# Patient Record
Sex: Female | Born: 1955 | Race: White | Hispanic: No | Smoking: Current every day smoker
Health system: Southern US, Community
[De-identification: ages and names within clinical notes are randomized; demographics above are authoritative.]

## PROBLEM LIST (undated history)

## (undated) DIAGNOSIS — I10 Essential (primary) hypertension: Secondary | ICD-10-CM

## (undated) DIAGNOSIS — F329 Major depressive disorder, single episode, unspecified: Secondary | ICD-10-CM

## (undated) DIAGNOSIS — E785 Hyperlipidemia, unspecified: Secondary | ICD-10-CM

## (undated) DIAGNOSIS — F32A Depression, unspecified: Secondary | ICD-10-CM

## (undated) HISTORY — DX: Essential (primary) hypertension: I10

## (undated) HISTORY — PX: BACK SURGERY: SHX140

## (undated) HISTORY — DX: Depression, unspecified: F32.A

## (undated) HISTORY — DX: Hyperlipidemia, unspecified: E78.5

---
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
## 2006-06-08 ENCOUNTER — Ambulatory Visit: Payer: Self-pay | Admitting: Oncology

## 2006-06-10 ENCOUNTER — Other Ambulatory Visit: Admission: RE | Admit: 2006-06-10 | Discharge: 2006-06-10 | Payer: Self-pay | Admitting: Oncology

## 2006-06-25 ENCOUNTER — Encounter (INDEPENDENT_AMBULATORY_CARE_PROVIDER_SITE_OTHER): Payer: Self-pay | Admitting: Specialist

## 2006-06-25 ENCOUNTER — Ambulatory Visit (HOSPITAL_COMMUNITY): Admission: RE | Admit: 2006-06-25 | Discharge: 2006-06-25 | Payer: Self-pay | Admitting: Oncology

## 2006-07-30 ENCOUNTER — Ambulatory Visit: Payer: Self-pay | Admitting: Oncology

## 2006-10-14 ENCOUNTER — Inpatient Hospital Stay (HOSPITAL_COMMUNITY): Admission: RE | Admit: 2006-10-14 | Discharge: 2006-10-18 | Payer: Self-pay | Admitting: Orthopaedic Surgery

## 2006-12-10 ENCOUNTER — Ambulatory Visit: Payer: Self-pay | Admitting: Oncology

## 2007-01-07 IMAGING — XA IR FLUORO GUIDE NDL PLMT / BX
1 series · 13 of 19 positions shown · non-contrast
Comparison: none

CLINICAL DATA: Patient with abnormal T12 vertebral body.  Previous history of back surgery. 
FLUOROSCOPICALLY GUIDED NEEDLE PLACEMENT FOR DEEP CORE BONE BIOPSY AT T12: 
Following the full explanation of the procedure along with the potentially associated complications, an informed witnessed consent was obtained.  
The patient was laid prone on the fluoroscopic table.  The skin overlying the thoracic region was prepped and draped in the usual sterile fashion.  The right pedicle at T12 was identified and infiltrated with 0.25% bupivacaine, including the overlying skin.  Using biplane intermittent fluoroscopy, an 11-gauge Jamshidi needle was advanced through the right pedicle into the posterior one-third at T12.  This was then exchanged for a KyphX advanced osteo introducer system comprised of a working cannula and a KyphX osteo drill.  This combination was then advanced until the tip of the KyphX osteo drill was in the posterior one-third at T12.  At this time, the bone pin was removed.  In a medial trajectory, the combination was advanced until the tip of the working cannula was inside the posterior third at T12.  At this time, the KyphX osteo drill was removed and a core sample sent for pathologic analysis.  
Through the working cannula, two more passes were made with the KyphX bone biopsy device in different directions, obtaining additional 4 ounces, which were sent. 
There were no acute complications.  The patient tolerated the procedure well.  
Medications utilized:  Versed 5 mg IV, fentanyl 250 mcg IV. 
The needle was then retrieved and removed, and hemostasis was achieved over the overlying skin.

[Series 1: run · 13 of 19 slices shown]
[im 1/19]
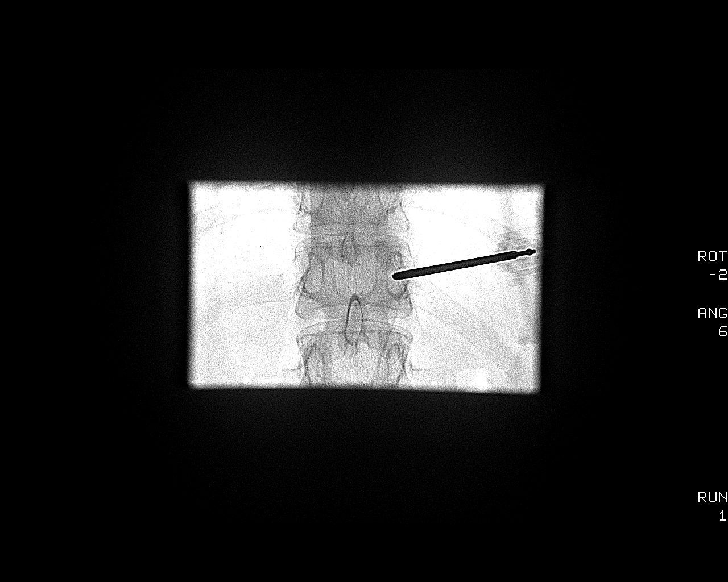
[im 3/19]
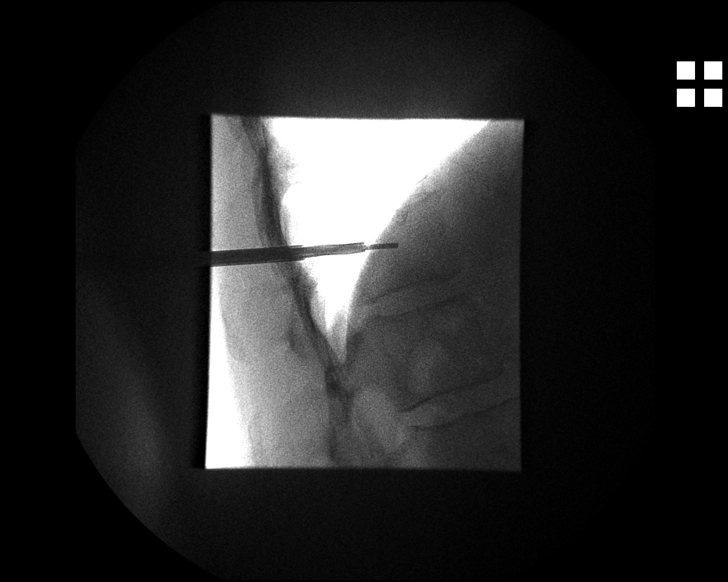
[im 4/19]
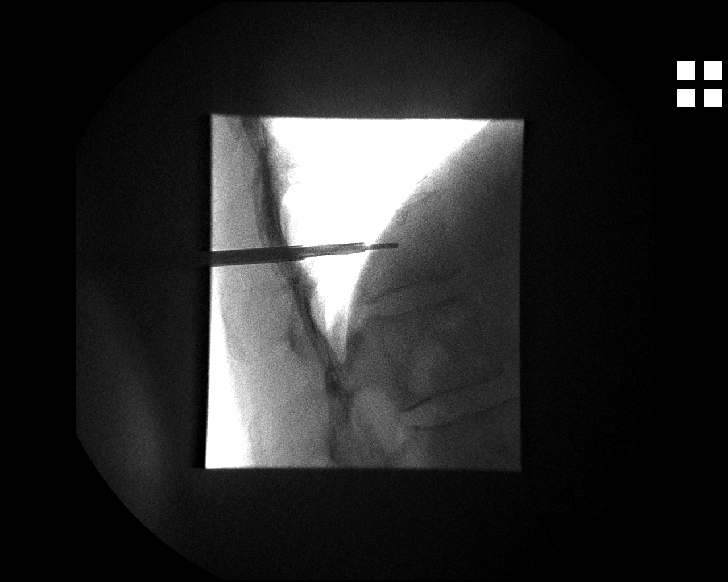
[im 6/19]
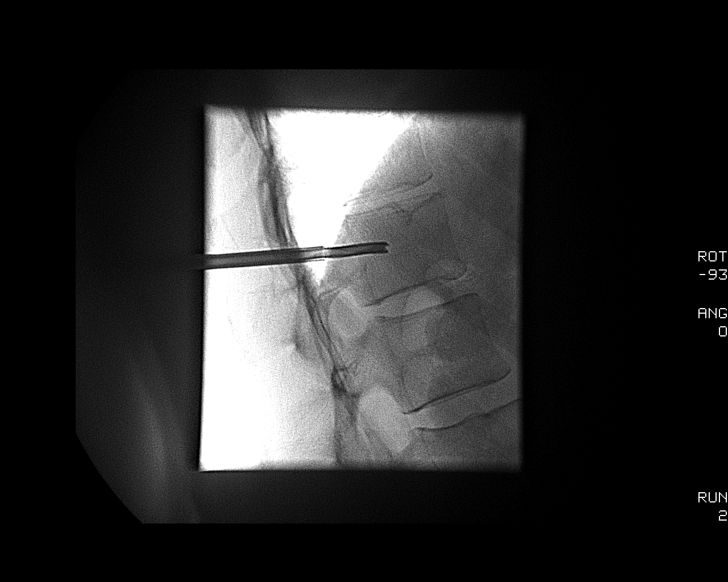
[im 7/19]
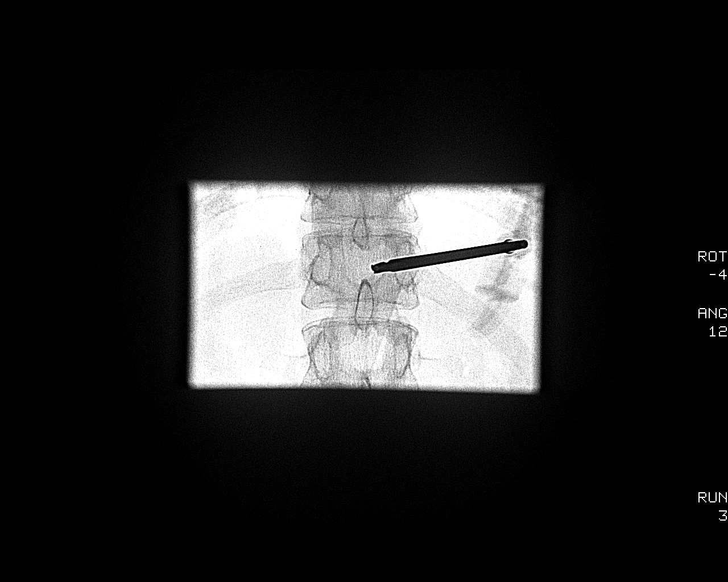
[im 9/19]
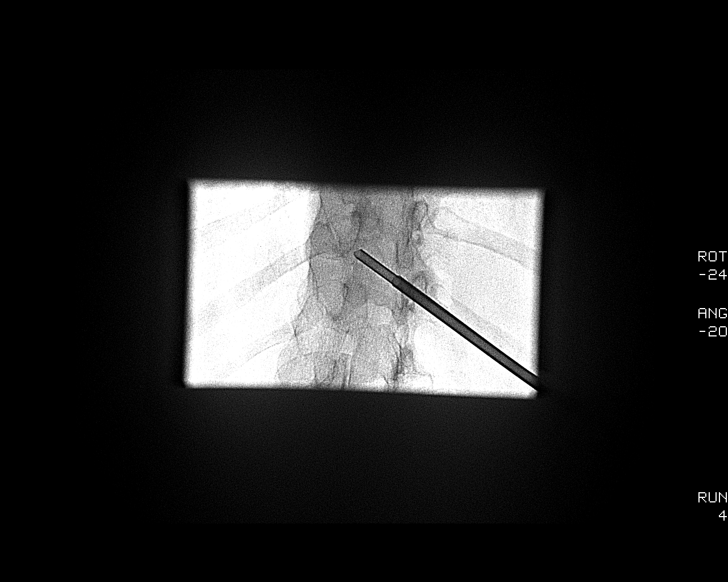
[im 10/19]
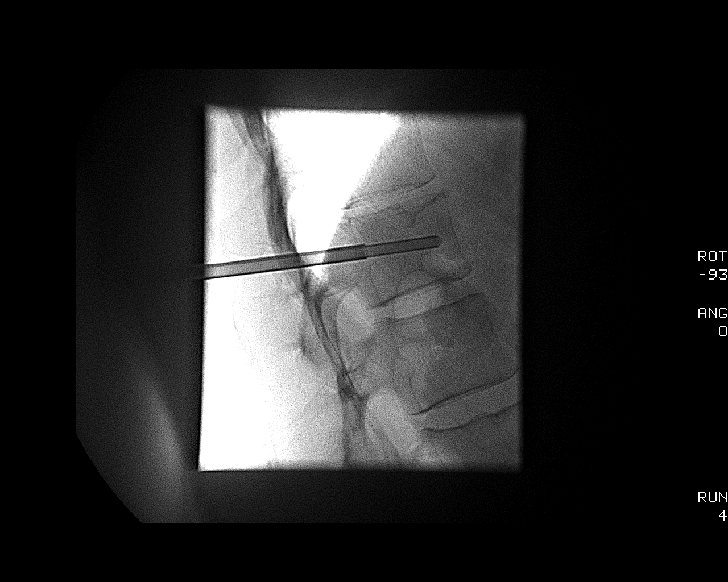
[im 11/19]
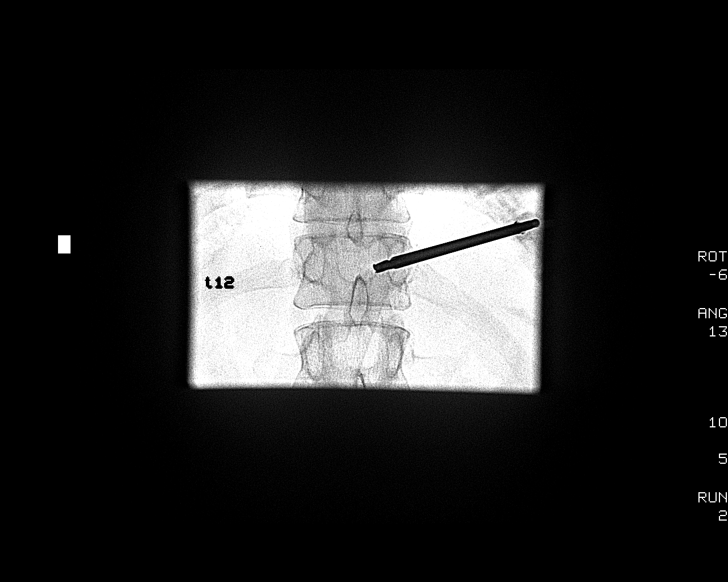
[im 13/19]
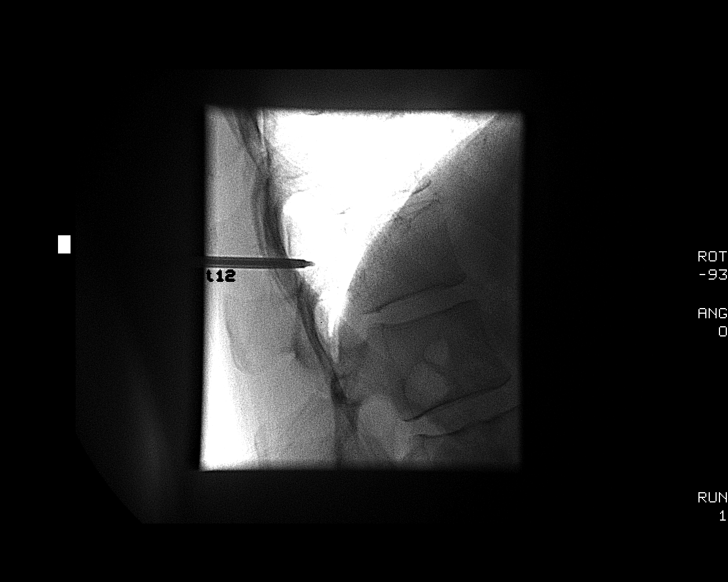
[im 14/19]
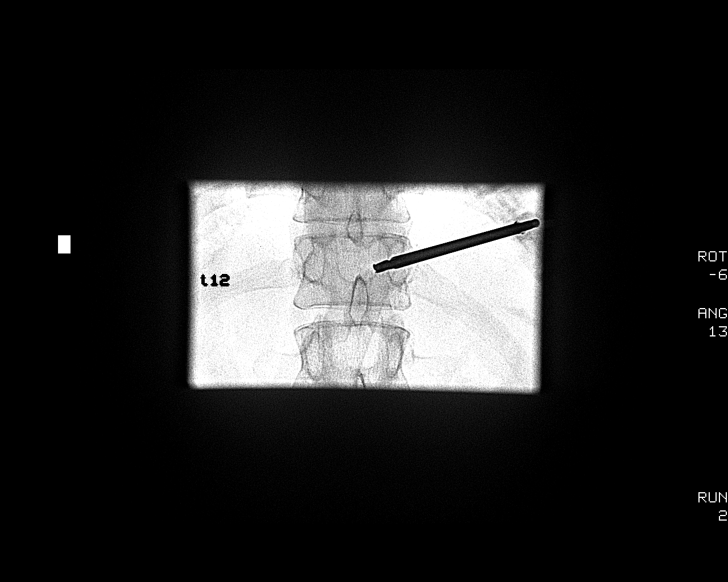
[im 16/19]
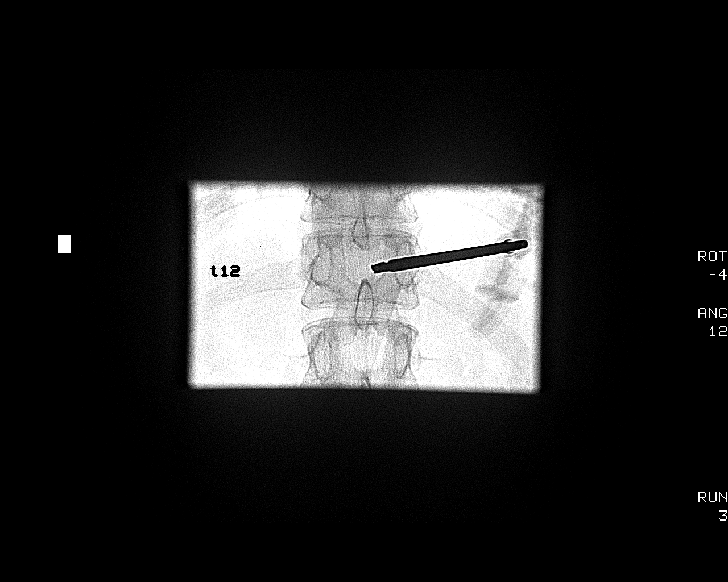
[im 17/19]
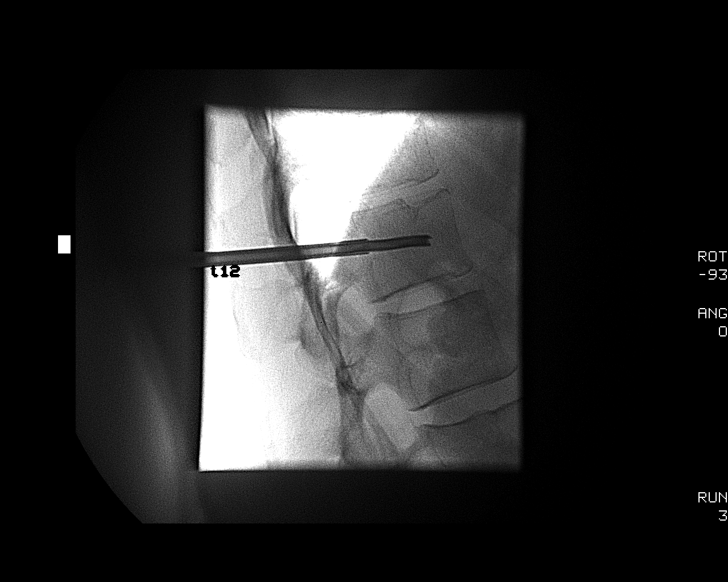
[im 19/19]
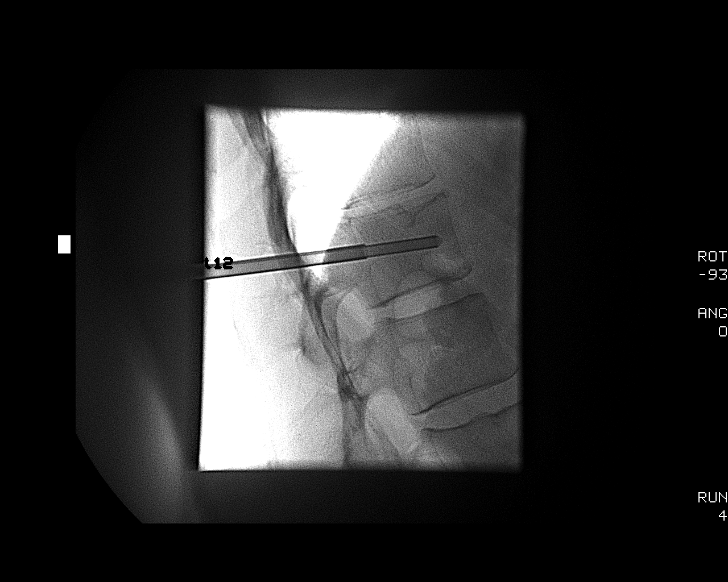

[13 of 19 positions shown; findings below may reference images not displayed]

IMPRESSION: Status post fluoroscopically guided needle placement for deep core bone biopsy at T12, as described, without event.

## 2007-03-18 ENCOUNTER — Ambulatory Visit: Payer: Self-pay | Admitting: Oncology

## 2007-05-02 IMAGING — CR DG LUMBAR SPINE 2-3V
2 series · 2 of 2 positions shown · non-contrast
Comparison: 10/14/2004

CLINICAL DATA: Post-op   lumbar fusion.
 LUMBAR SPINE - 2 VIEW:

[view not recorded (1 of 2)]
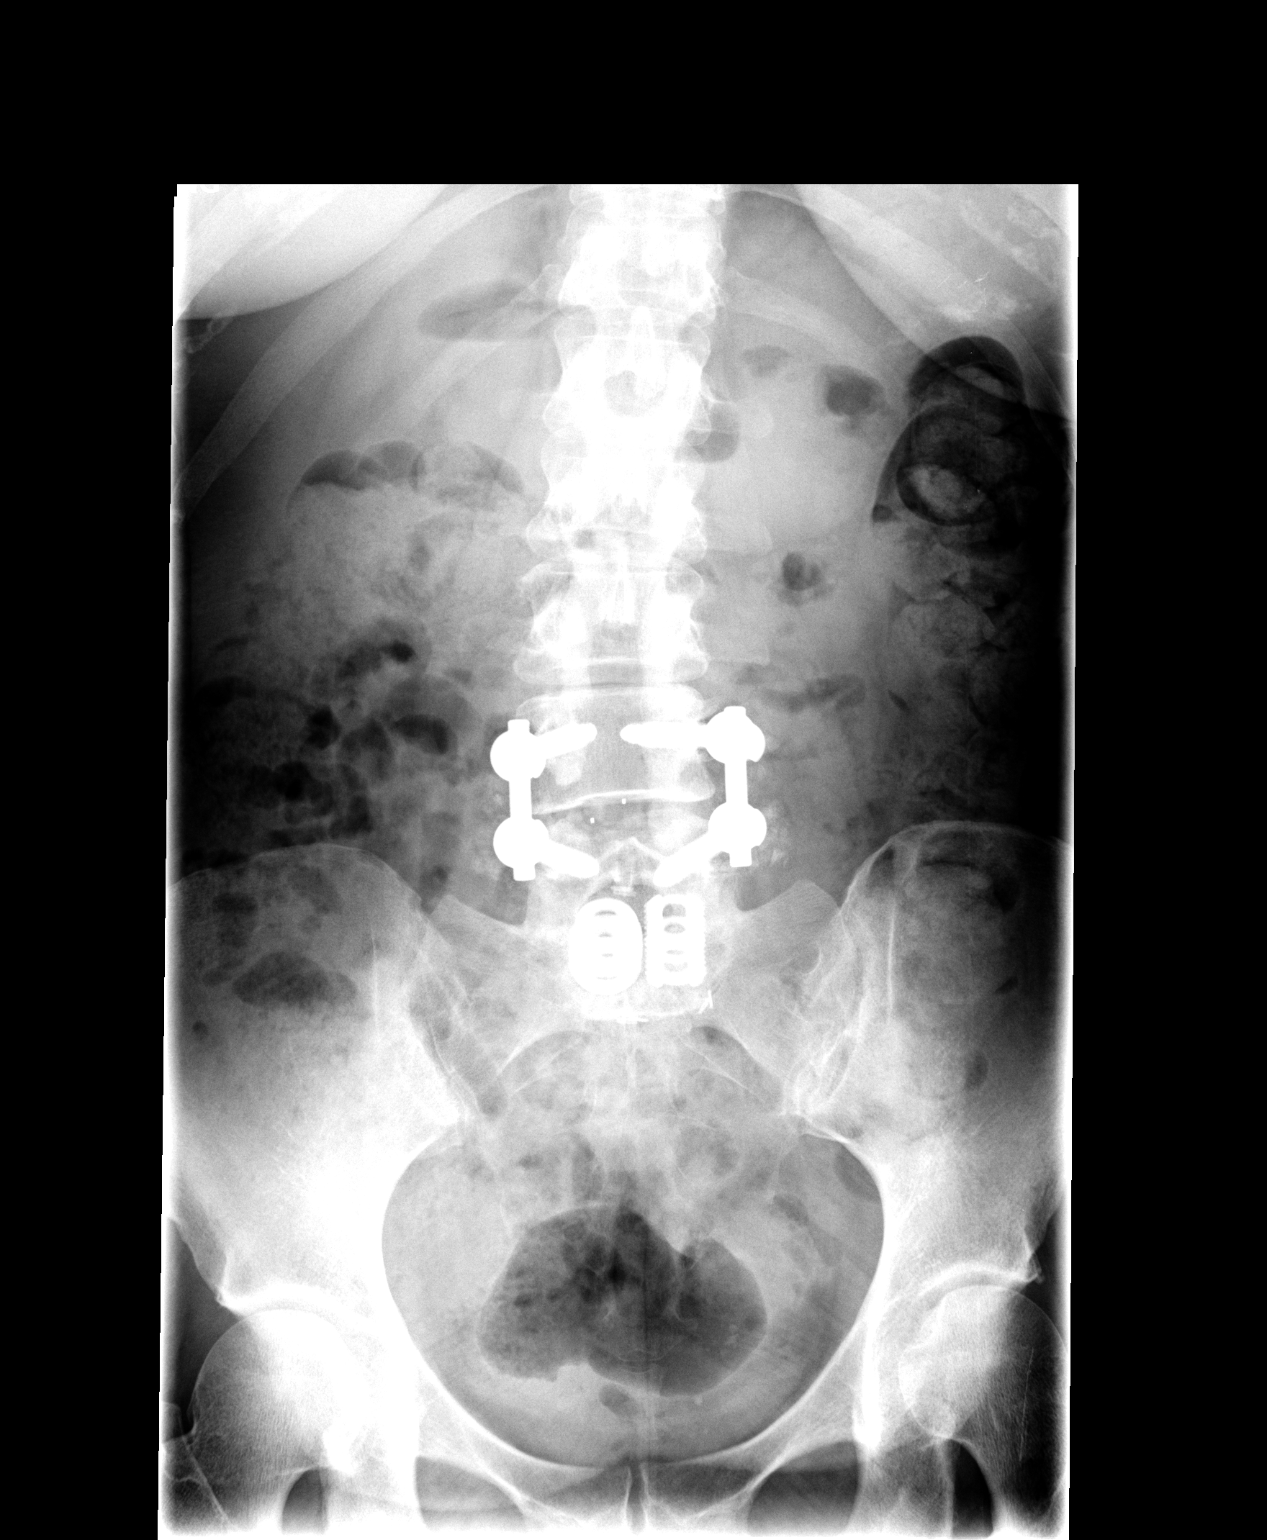

[view not recorded (2 of 2)]
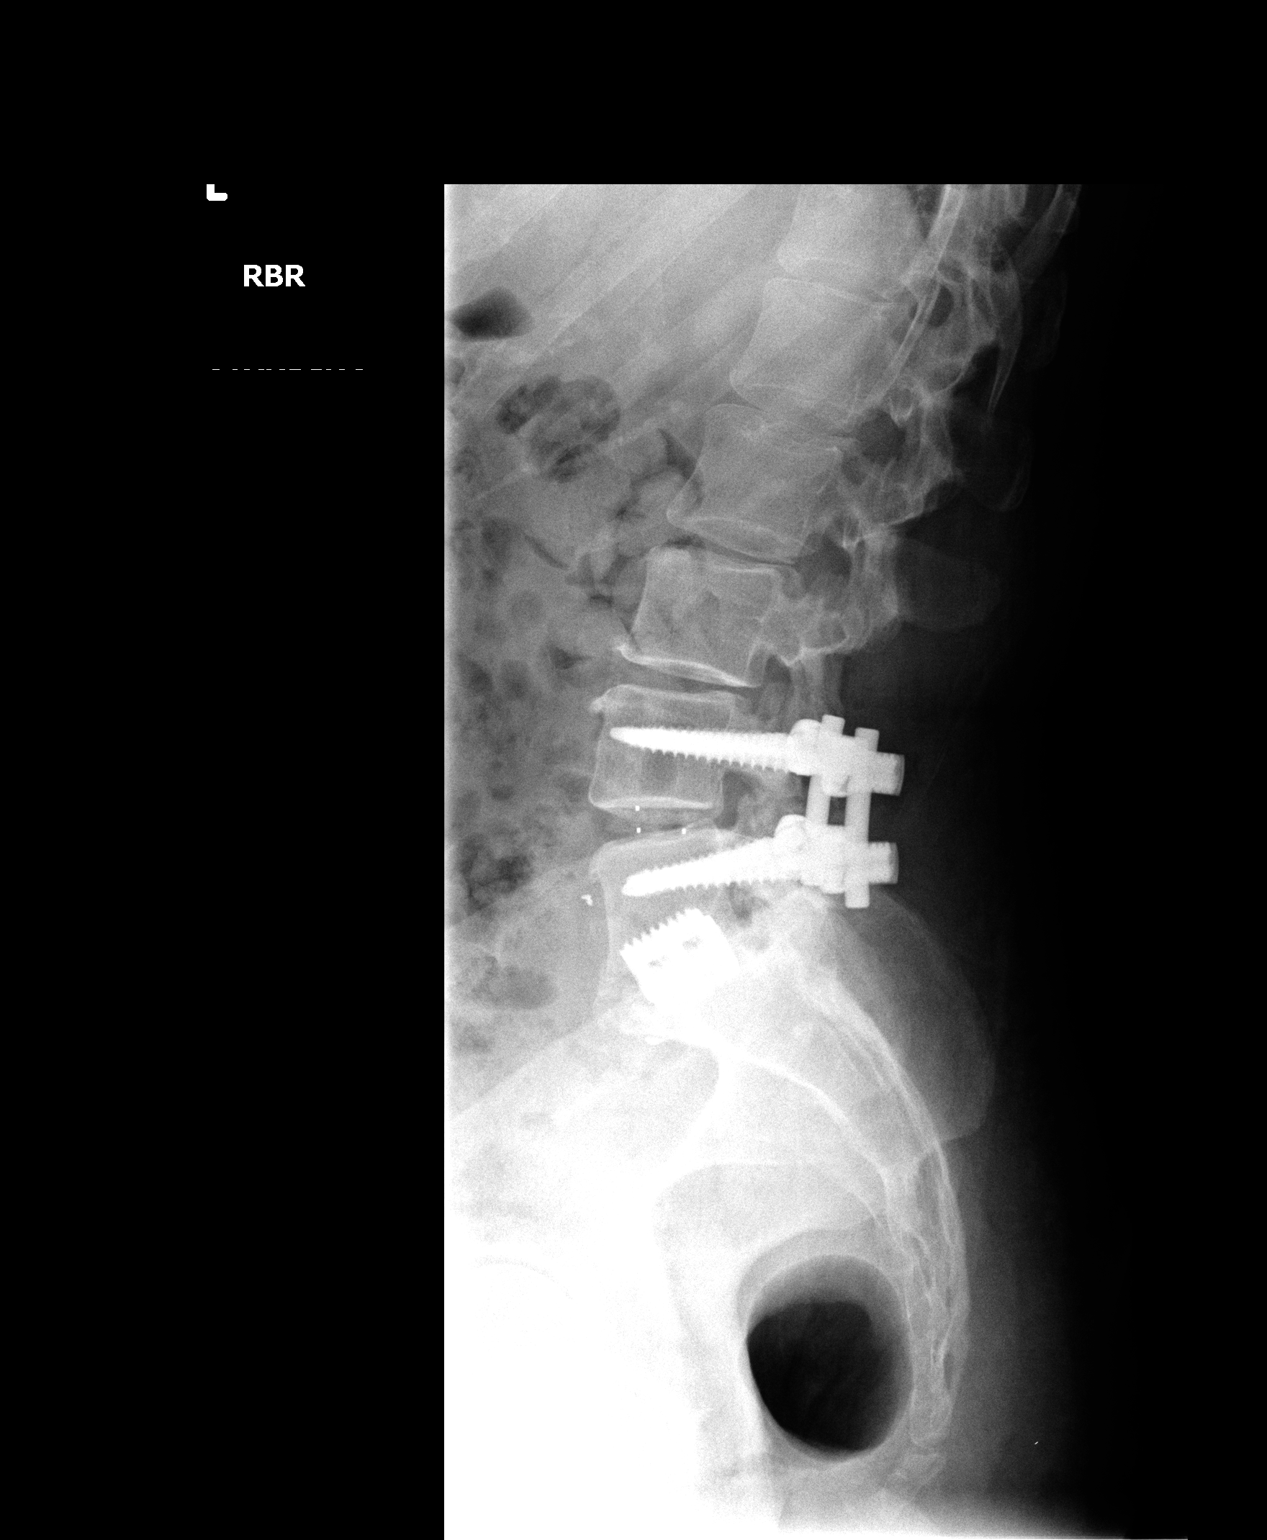

[2 of 2 positions shown; findings below may reference images not displayed]

FINDINGS: Bilateral pedicle screws and cross stabilizing bars are present in L4 and L5.  Ray cages are present in the L5-S1 disk space.  There is no breakage or loosening of the hardware. Alignment is stable compared with 10/14/2006.  There is no vertebral compression fracture.
IMPRESSION: Post-operative changes.  No interval change.

## 2007-08-06 ENCOUNTER — Ambulatory Visit: Payer: Self-pay | Admitting: Oncology

## 2011-03-28 NOTE — Discharge Summary (Signed)
Kendra Brewer, Kendra Brewer               ACCOUNT NO.:  [id number]   MEDICAL RECORD NO.:  [id number]          PATIENT TYPE:  INP   LOCATION:  5018                         FACILITY:  [facility]   PHYSICIAN:  Sharolyn Douglas, M.D.        DATE OF BIRTH:  1956-04-14   DATE OF ADMISSION:  10/14/2006  DATE OF DISCHARGE:  10/18/2006                               DISCHARGE SUMMARY   ADMITTING DIAGNOSES:  1. Degenerative disk disease with spinal stenosis, L2-L5.  2. Anxiety.   DISCHARGE DIAGNOSES:  1. Status post L2-L5 laminectomy and L4-5 posterior spinal fusion.  2. Postoperative blood loss anemia.  3. Anxiety.   CONSULT:  None.   PROCEDURE:  On October 14, 2006, the patient was taken to the operating  for an L2-L5 laminectomy and L4-5 posterior spinal fusion with pedicle  screws and TLIF.   SURGEON:  Sharolyn Douglas, MD.   ASSISTANTJill Side Mahar, P.A.C.   ANESTHESIA:  General.   LABS:  CBC with diff, preop, had a red cell count of 3.7, MCV 102.2,  neutrophils of 43, lymphs of 47, absolute lymphs of 3.2; otherwise,  normal.  H&H monitored 3 days, postoperatively, which showed a low of 10  and 29.2 on October 16, 2006.  PT, INR and PTT, preop, normal.  Complete  metabolic panel, preop, was normal.  Basic metabolic panel monitored x2  days, postoperatively, showed her to have decreased calcium on both days  at 8.2.  On postoperatively day 2, she had a glucose of 116 and a BUN of  4; otherwise, normal.  UA was negative preoperatively.  Blood type was  A+ and antibody screen was negative.  Urine culture from preop showed no  growth.  EKG from October 13, 2006, showed sinus bradycardia; otherwise,  normal.  X-rays of the lumbar spine were used intraoperatively for  localization.  Also 2 views of the lumbar spine, October 18, 2006,  showed L4-5 pedicle screws in good position.   BRIEF HISTORY:  Patient is a 55 year old female who has had a long  history of problems with the back.  She has had a previous  lumbar  fusion.  She has actually had 3 previous lumbar surgeries.  Unfortunately, she continued to have increasing pain.  She failed to  respond to conservative treatment and it was felt secondary to her  findings on MRI of spinal stenosis, as well as degenerative disk disease  and spondylolisthesis, it would be in her best interest to proceed with  a revision decompression as well as a posterior spine fusion.  The risks  and benefits of this procedure were discussed with the patient at length  by Dr. Noel Gerold as well as myself.  She indicated understanding and opted  to proceed.   HOSPITAL COURSE:  On October 14, 2006 the patient was taken to the  operating room for the above procedures.  She tolerated the procedures  well without any intraoperative complications.  She was transferred to  the recovery room in stable condition.   Postoperatively, routine orthopedic spine protocol was followed.  She  progressed along  very well through her postoperative course.   Physical therapy and occupational therapy worked with her on a daily  basis on ways to use precautions as well as a progressive ambulation  program.  She progressed along very well with them and got to the point  that she was safe an independent prior to discharge.   By October 18, 2006 patient had met all orthopedic goals.  She was  medically stable and ready for discharge home.   DISCHARGE PLAN:  Patient is a 55 year old female, status post revision  laminectomy and posterior spinal fusion L2-L5.   ACTIVITY:  Daily ambulation program.  Brace on when she is up.  Back  precautions at all time.  No lifting greater than 5 pounds.  Patient may  shower.  Dressing as needed on the back.   FOLLOWUP:  In 2 weeks postop with Dr. Noel Gerold.   CONDITION ON DISCHARGE:  Is stable, improved.   DIET:  Is regular home diet as tolerated.   DISPOSITION:  Patient will be discharged to home with her family  assistance as well as home health,  physical therapy and occupational  therapy.      Verlin Fester, P.A.      Sharolyn Douglas, M.D.  Electronically Signed    CM/MEDQ  D:  12/23/2006  T:  12/23/2006  Job:  [id number]

## 2011-03-28 NOTE — Op Note (Signed)
NAMEJENTRY, MCQUEARY NO.:  [id number]   MEDICAL RECORD NO.:  [id number]          PATIENT TYPE:  INP   LOCATION:  5018                         FACILITY:  [facility]   PHYSICIAN:  Sharolyn Douglas, M.D.        DATE OF BIRTH:  September 15, 1956   DATE OF PROCEDURE:  10/14/2006  DATE OF DISCHARGE:                               OPERATIVE REPORT   DIAGNOSES:  1. Adjacent segment lumbar spinal stenosis above previous L5-S1      laminectomy and fusion.  2. Lumbar spondylosis.  3. Chronic back and right greater than left leg pain.   PROCEDURE:  1. Revision L4-5 lumbar laminectomy with wide decompression of the      thecal sac and nerve roots bilaterally.  2. Primary L2-3 and L3-4 lumbar laminectomy with wide decompression of      thecal sac and nerve roots bilaterally.  3. Transforaminal lumbar interbody fusion L4-5 with placement of 9-mm      PEEK cage.  4. Pedicle screw instrumentation L4-5 using the Abbott's spine system.  5. Posterior spinal fusion L4-5.  6. Local autogenous bone graft supplemented with bone morphogenic      protein.   SURGEON:  Sharolyn Douglas, M.D.   ASSISTANT:  Verlin Fester, PAC.   ANESTHESIA:  General endotracheal.   ESTIMATED BLOOD LOSS:  100 mL.   COMPLICATIONS:  None.   Needle and sponge count correct.   INDICATION:  The patient is a pleasant 55 year old female who had a  previous L5-S1 anterior-posterior laminectomy and fusion.  She has  developed progressive back and leg pain.  Her imaging studies show  severe spinal stenosis and spondylosis above the fusion at L4-5 as well  as stenosis at L2-3 and L3-4.  She now presents for lumbar decompression  and fusion at L4-5 with laminectomy up to the L2-3 level.  Risks,  benefits and alternatives were reviewed.  She elected to proceed.   PROCEDURE:  After informed consent, she was taken to the operating room  and underwent general endotracheal anesthesia without difficulty.  Given  prophylactic IV  antibiotics.  Neural monitoring was established in the  form of lower extremity EMGs and SSEPs.  She was carefully turned prone  onto the Wilson frame.  All bony prominences padded.  Face and eyes  protected at all times.  Back prepped and draped in the usual sterile  fashion.  The previous midline incision was utilized and extended  several inches proximally.  Dissection was carried through the deep  fascia and scar.  Subperiosteal exposure carried out to the tips of the  transverse processes of L4 and L5.  The previous laminotomy on the right  side was identified and care was taken not to inadvertently enter the  epidural space.  Intraoperative x-ray was taken to confirm levels.  Deaver retractor placed.  We then started a wide laminectomy by removing  the entire residual spinous process of L4.  We used curettes, loops and  headlight magnification to dissect the epidural fibrosis off the  undersurface of the lamina.  We were able to enter the  spinal canal and  begin the decompression proximally using the high-speed burr and  Kerrison punches.  Once we got through the L4 lamina and into the L3-4  interspace, there was no more scar tissue.  We found severe spinal  stenosis at L4-5 due to ligamentum flavum hypertrophy.  The facet joints  were also hypertrophied.  The decompression was carried out flush with  the pedicles and the nerve roots were identified at L3,4 and 5 and  decompressed widely.  We undercut the lamina of L2 and removed the  entire lamina of L3 and L4.  Once we were satisfied with the  decompression, we placed pedicle screws at L4 and L5 using the anatomic  probing technique.  Each pedicle was palpated with ball-tipped feeler.  There were no breaches.  We could also palpate the pedicles from within  the spinal canal.  The pedicles were tapped and we placed 6.5 x 50-mm  screws at L4 and 6.5 x 45-mm screws at L5.  The bone quality was soft,  but the screw purchase was good.   We stimulated each screw with  triggered EMGs, and there were no deleterious changes.  Before placing  the screws, we had decorticated the transfer processes with a high-speed  burr in preparation for the fusion.  In addition, the laminectomy bone  had been cleaned and morselized.  We then packed this into the lateral  gutters over the L4 and L5 transverse processes.  At this point, we  elected to proceed with a transforaminal lumbar interbody fusion to  improve the fusion rate at L4-5 and allow for further indirect  decompression of the neuroforamen.  On the right side, the remaining  facet joint was osteotomized.  Disk space was entered.  The disk space  was dilated up to 9 mm.  We then scraped the cartilaginous endplates  clean and performed a radical diskectomy.  The space was packed with BNP  sponges along with local bone graft from the laminectomy.  A 9-mm PEEK  cage was then inserted into the interspace, tamped anteriorly and across  the midline.  We placed 40-mm titanium rods into the polyaxial screw  heads and applied compression before sheering off the caps.  Intraoperative x-rays show good position over the instrumentation and  the interbody cage.  Hemostasis was achieved.  The wound was irrigated.  Gelfoam left over the exposed epidural space.  The deep fascia closed  with running #1-Vicryl suture.  The subcutaneous layer closed with 2-0  Vicryl followed by a running 3-0 subcuticular Vicryl suture on the skin  edges, benzoin and Steri-Strips placed.  Sterile dressing applied.  The  patient was turned supine, extubated without difficulty and transferred  to recovery in stable condition, able to move her upper and lower  extremities.   It should be noted my assistant, [employer], PA, was present  throughout the procedure including during the positioning, the exposure,  the decompression, the instrumentation, and she also assisted with wound  closure.     Sharolyn Douglas,  M.D.  Electronically Signed     MC/MEDQ  D:  10/14/2006  T:  10/15/2006  Job:  [id number]

## 2011-03-28 NOTE — H&P (Signed)
Kendra Brewer, Kendra Brewer               ACCOUNT NO.:  [id number]   MEDICAL RECORD NO.:  [id number]          PATIENT TYPE:  INP   LOCATION:  NA                           FACILITY:  [facility]   PHYSICIAN:  [employer], P.A.    DATE OF BIRTH:  27-Oct-1956   DATE OF ADMISSION:  DATE OF DISCHARGE:                              HISTORY & PHYSICAL   CHIEF COMPLAINT:  Low back and bilateral lower extremity pain, right  greater than left.   HISTORY:  The patient is a 55 year old female who has been having  increasing back and lower extremity pain for some time.  The pain has  gotten to the point there it is increasing, interfering with her  activities of daily living and quality of life.  Because of her failure  to improve with conservative treatments as well as her x-ray and MRI  findings of spinal stenosis at L2-L5, it was felt that her best course  of management would be a revision laminectomy L2-L5 with an L4-5  posterior spinal fusion.  Risks and benefits of the proposed surgery  were discussed with the patient by Dr. Noel Gerold as well as myself.  She  indicated understanding that and opted to proceed.   ALLERGIES:  SULFA.   MEDICATIONS:  OxyContin, Oxycodone, Lexapro, lorazepam, doxycycline,  Vytorin, B12, fish oil.   PAST MEDICAL HISTORY:  Anxiety.   PAST SURGICAL HISTORY:  1. Lumbar surgery x3 both anterior and posterior.  2. C-section x2.  3. Superficial cyst removed from her neck.  4. Hernia repair.   SOCIAL HISTORY:  Patient is married.  She smokes less than one pack  cigarettes per day.  She is trying to quit.  She denies alcohol use.   FAMILY HISTORY:  Noncontributory.   REVIEW OF SYSTEMS:  Patient denies fever, chills, sweats, or bleeding  tendencies.  CNS:  Denies blurred vision, double vision, seizures,  headache, paralysis.  CARDIOVASCULAR:  Denies chest pain, angina,  orthopnea, claudication, palpitations.  PULMONARY:  Denies shortness of  breath, productive cough, or  hemoptysis.  GI:  Denies nausea, vomiting,  constipation, diarrhea, melena, or bloody stool.  GU:  Denies dysuria,  hematuria discharge.  MUSCULOSKELETAL:  As per HPI.   PHYSICAL EXAMINATION:  VITAL SIGNS:  Blood pressure 120/ 72,  respirations 16 and unlabored, pulse 84 and regular.  GENERAL:  The patient is a 55 year old white female who is alert and  oriented, no acute distress.  She is well nourished, well groomed and  appears her stated age, pleasant and cooperative to exam.  HEENT:  Head is normocephalic, atraumatic.  Pupils are equal round and  reactive; extraocular movements intact.  Nares patent.  Pharynx is  clear.  No bruits appreciated.  CHEST:  Clear to auscultation bilaterally, no rales, rhonchi, stridor,  wheezes or friction rubs.  BREASTS:  Not pertinent and not performed.  HEART:  S1 S2, regular rate and rhythm, no murmur, rubs, gallops noted.  ABDOMEN:  Soft to palpation, nontender, nondistended, no organomegaly  noted.  Positive bowel sounds throughout.  GU:  Nor pertinent and not performed.  EXTREMITIES:  As per HPI.  SKIN:  Intact without any lesions or rashes.   IMPRESSION:  1. Spinal stenosis L2 to L5.  2. Previous L5-S1 fusion.  3. Anxiety.   PLAN:  Admit to [facility] on October 14, 2006 for a revision  decompression at L2 to L5 and posterior spinal fusion at L4-5.  This  will be done by Dr. Sharolyn Douglas at [facility].      [place], [place].     CM/MEDQ  D:  10/13/2006  T:  10/14/2006  Job:  [id number]   cc:   Sharolyn Douglas, M.D.

## 2016-01-28 DIAGNOSIS — D649 Anemia, unspecified: Secondary | ICD-10-CM | POA: Diagnosis not present

## 2016-01-28 DIAGNOSIS — Z87891 Personal history of nicotine dependence: Secondary | ICD-10-CM

## 2016-01-28 DIAGNOSIS — R609 Edema, unspecified: Secondary | ICD-10-CM | POA: Diagnosis not present

## 2016-01-28 DIAGNOSIS — G8929 Other chronic pain: Secondary | ICD-10-CM | POA: Diagnosis not present

## 2016-01-28 DIAGNOSIS — D472 Monoclonal gammopathy: Secondary | ICD-10-CM | POA: Diagnosis not present

## 2016-12-01 DIAGNOSIS — D472 Monoclonal gammopathy: Secondary | ICD-10-CM | POA: Diagnosis not present

## 2016-12-01 DIAGNOSIS — D649 Anemia, unspecified: Secondary | ICD-10-CM | POA: Diagnosis not present

## 2018-12-01 DIAGNOSIS — D472 Monoclonal gammopathy: Secondary | ICD-10-CM

## 2019-08-13 ENCOUNTER — Encounter: Payer: Self-pay | Admitting: Cardiology

## 2019-08-13 NOTE — Progress Notes (Deleted)
Cardiology Office Note:    Date:  08/13/2019   ID:  Kendra Brewer, DOB March 05, 1956, MRN [id number]  PCP:  Imagene Riches, NP  Cardiologist:  Shirlee More, MD   Referring MD: Imagene Riches, NP  ASSESSMENT:    No diagnosis found. PLAN:    In order of problems listed above:  1. ***  Next appointment   Medication Adjustments/Labs and Tests Ordered: Current medicines are reviewed at length with the patient today.  Concerns regarding medicines are outlined above.  No orders of the defined types were placed in this encounter.  No orders of the defined types were placed in this encounter.    No chief complaint on file. ***  History of Present Illness:    Kendra Brewer is a 63 y.o. female who is being seen today for the evaluation of *** at the request of York, Regina F, NP.   No past medical history on file.  *** The histories are not reviewed yet. Please review them in the "History" navigator section and refresh this [facility].  Current Medications: No outpatient medications have been marked as taking for the 08/16/19 encounter (Appointment) with Richardo Priest, MD.     Allergies:   Patient has no allergy information on record.   Social History   Socioeconomic History  . Marital status: Divorced    Spouse name: Not on file  . Number of children: Not on file  . Years of education: Not on file  . Highest education level: Not on file  Occupational History  . Not on file  Social Needs  . Financial resource strain: Not on file  . Food insecurity    Worry: Not on file    Inability: Not on file  . Transportation needs    Medical: Not on file    Non-medical: Not on file  Tobacco Use  . Smoking status: Not on file  Substance and Sexual Activity  . Alcohol use: Not on file  . Drug use: Not on file  . Sexual activity: Not on file  Lifestyle  . Physical activity    Days per week: Not on file    Minutes per session: Not on file  . Stress: Not on file   Relationships  . Social Herbalist on phone: Not on file    Gets together: Not on file    Attends religious service: Not on file    Active member of club or organization: Not on file    Attends meetings of clubs or organizations: Not on file    Relationship status: Not on file  Other Topics Concern  . Not on file  Social History Narrative  . Not on file     Family History: The patient's ***family history is not on file.  ROS:   ROS Please see the history of present illness.    *** All other systems reviewed and are negative.  EKGs/Labs/Other Studies Reviewed:    The following studies were reviewed today: ***  EKG:  EKG is *** ordered today.  The ekg ordered today is personally reviewed and demonstrates ***  Recent Labs: No results found for requested labs within last 8760 hours.  Recent Lipid Panel No results found for: CHOL, TRIG, HDL, CHOLHDL, VLDL, LDLCALC, LDLDIRECT  Physical Exam:    VS:  There were no vitals taken for this visit.    Wt Readings from Last 3 Encounters:  No data found for Wt     GEN: ***  Well nourished, well developed in no acute distress HEENT: Normal NECK: No JVD; No carotid bruits LYMPHATICS: No lymphadenopathy CARDIAC: ***RRR, no murmurs, rubs, gallops RESPIRATORY:  Clear to auscultation without rales, wheezing or rhonchi  ABDOMEN: Soft, non-tender, non-distended MUSCULOSKELETAL:  No edema; No deformity  SKIN: Warm and dry NEUROLOGIC:  Alert and oriented x 3 PSYCHIATRIC:  Normal affect     Signed, Shirlee More, MD  08/13/2019 2:17 PM    [facility]

## 2019-08-16 ENCOUNTER — Ambulatory Visit: Payer: Medicare Other | Admitting: Cardiology

## 2020-01-04 DIAGNOSIS — D472 Monoclonal gammopathy: Secondary | ICD-10-CM | POA: Diagnosis not present

## 2020-03-29 DIAGNOSIS — M47816 Spondylosis without myelopathy or radiculopathy, lumbar region: Secondary | ICD-10-CM | POA: Diagnosis not present

## 2020-03-29 DIAGNOSIS — G894 Chronic pain syndrome: Secondary | ICD-10-CM | POA: Diagnosis not present

## 2020-03-29 DIAGNOSIS — M961 Postlaminectomy syndrome, not elsewhere classified: Secondary | ICD-10-CM | POA: Diagnosis not present

## 2020-03-29 DIAGNOSIS — M5136 Other intervertebral disc degeneration, lumbar region: Secondary | ICD-10-CM | POA: Diagnosis not present

## 2020-04-24 DIAGNOSIS — M961 Postlaminectomy syndrome, not elsewhere classified: Secondary | ICD-10-CM | POA: Diagnosis not present

## 2020-04-24 DIAGNOSIS — M5136 Other intervertebral disc degeneration, lumbar region: Secondary | ICD-10-CM | POA: Diagnosis not present

## 2020-04-24 DIAGNOSIS — M47816 Spondylosis without myelopathy or radiculopathy, lumbar region: Secondary | ICD-10-CM | POA: Diagnosis not present

## 2020-05-28 DIAGNOSIS — M47816 Spondylosis without myelopathy or radiculopathy, lumbar region: Secondary | ICD-10-CM | POA: Diagnosis not present

## 2020-05-28 DIAGNOSIS — Z1389 Encounter for screening for other disorder: Secondary | ICD-10-CM | POA: Diagnosis not present

## 2020-05-28 DIAGNOSIS — M5136 Other intervertebral disc degeneration, lumbar region: Secondary | ICD-10-CM | POA: Diagnosis not present

## 2020-06-25 DIAGNOSIS — M5136 Other intervertebral disc degeneration, lumbar region: Secondary | ICD-10-CM | POA: Diagnosis not present

## 2020-06-25 DIAGNOSIS — M792 Neuralgia and neuritis, unspecified: Secondary | ICD-10-CM | POA: Diagnosis not present

## 2020-07-26 DIAGNOSIS — G894 Chronic pain syndrome: Secondary | ICD-10-CM | POA: Diagnosis not present

## 2020-07-26 DIAGNOSIS — M792 Neuralgia and neuritis, unspecified: Secondary | ICD-10-CM | POA: Diagnosis not present

## 2020-07-26 DIAGNOSIS — M5136 Other intervertebral disc degeneration, lumbar region: Secondary | ICD-10-CM | POA: Diagnosis not present

## 2020-08-23 DIAGNOSIS — Z20822 Contact with and (suspected) exposure to covid-19: Secondary | ICD-10-CM | POA: Diagnosis not present

## 2020-08-28 DIAGNOSIS — M792 Neuralgia and neuritis, unspecified: Secondary | ICD-10-CM | POA: Diagnosis not present

## 2020-08-28 DIAGNOSIS — M5136 Other intervertebral disc degeneration, lumbar region: Secondary | ICD-10-CM | POA: Diagnosis not present

## 2020-09-25 DIAGNOSIS — M792 Neuralgia and neuritis, unspecified: Secondary | ICD-10-CM | POA: Diagnosis not present

## 2020-09-25 DIAGNOSIS — G894 Chronic pain syndrome: Secondary | ICD-10-CM | POA: Diagnosis not present

## 2020-09-25 DIAGNOSIS — M5136 Other intervertebral disc degeneration, lumbar region: Secondary | ICD-10-CM | POA: Diagnosis not present

## 2020-09-26 DIAGNOSIS — Z23 Encounter for immunization: Secondary | ICD-10-CM | POA: Diagnosis not present

## 2020-09-26 DIAGNOSIS — E785 Hyperlipidemia, unspecified: Secondary | ICD-10-CM | POA: Diagnosis not present

## 2020-09-26 DIAGNOSIS — D519 Vitamin B12 deficiency anemia, unspecified: Secondary | ICD-10-CM | POA: Diagnosis not present

## 2020-09-26 DIAGNOSIS — E559 Vitamin D deficiency, unspecified: Secondary | ICD-10-CM | POA: Diagnosis not present

## 2020-09-26 DIAGNOSIS — I10 Essential (primary) hypertension: Secondary | ICD-10-CM | POA: Diagnosis not present

## 2020-10-23 DIAGNOSIS — M5136 Other intervertebral disc degeneration, lumbar region: Secondary | ICD-10-CM | POA: Diagnosis not present

## 2020-10-23 DIAGNOSIS — M792 Neuralgia and neuritis, unspecified: Secondary | ICD-10-CM | POA: Diagnosis not present

## 2020-10-23 DIAGNOSIS — G894 Chronic pain syndrome: Secondary | ICD-10-CM | POA: Diagnosis not present

## 2021-01-02 NOTE — Progress Notes (Incomplete)
[name]  [address] [place],  [place]  [address] [phone]  Clinic Day:  01/02/2021  Referring physician: Imagene Riches, NP  This document serves as a record of services personally performed by Hosie Poisson, MD. It was created on their behalf by Curry,Lauren E, a trained medical scribe. The creation of this record is based on the scribe's personal observations and the provider's statements to them.  CHIEF COMPLAINT:  CC: MGUS  Current Treatment:  Surveillance   HISTORY OF PRESENT ILLNESS:  Kendra Brewer is a 65 y.o. 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  This has been very stable.  She was not seen from January 2014 to September of 2016.  Unfortunately, she has chronic severe pain from severe degenerative disc and degenerative joint disease which limits her activity.  Her M-spike actually decreased in 2020 from 0.7 to 0.6.    She is here for routine follow up and continues to report severe disabling back pain.  She rates her pain as a 9/10 today, and is unable to sit up for any length of time.  She follows up with pain care management and states that they have stopped prescribing many of her medications.  She is no longer taking Xtampza since this did not improve her pain, and she continues oxycodone.  She denies pain elsewhere.  She reports nausea and anxiety due to her pain.  Her blood counts and chemistries are unremarkable except for a creatinine of 1.4, increased from 1.1.  She has not taken any anti-inflammatory medication, but notes that she did take a diuretic two days ago.  I advised that she drink plenty of fluids and avoid diuretic unless absolutely necessary.  Her appetite is good, and she has gained 1 pound since her last visit.  She denies fever or chills.  She denies nausea, vomiting bowel issues, or abdominal pain.  She denies sore throat, cough,  dyspnea, or chest pain.   INTERVAL HISTORY:  Kendra Brewer is here for annual follow up ***.   Her  appetite is good, and she has gained/lost _ pounds since her last visit.  She denies fever, chills or other signs of infection.  She denies nausea, vomiting, bowel issues, or abdominal pain.  She denies sore throat, cough, dyspnea, or chest pain.  REVIEW OF SYSTEMS:  Review of Systems - Oncology   VITALS:  There were no vitals taken for this visit.  Wt Readings from Last 3 Encounters:  No data found for Wt    There is no height or weight on file to calculate BMI.  Performance status (ECOG): {CHL ONC [id number]  PHYSICAL EXAM:  Physical Exam  LABS:  No flowsheet data found. No flowsheet data found.    No results found for: TOTALPROTELP, ALBUMINELP, A1GS, A2GS, BETS, BETA2SER, GAMS, MSPIKE, SPEI No results found for: TIBC, FERRITIN, IRONPCTSAT No results found for: LDH   STUDIES:  No results found.   Allergies: Not on File  Current Medications: No current outpatient medications on file.   No current facility-administered medications for this visit.     ASSESSMENT & PLAN:   Assessment:   1.  Monoclonal gammopathy of uncertain significance, diagnosed in July 2007, with no evidence of progression.  In fact her M-spike decreased from 0.7 to 0.6 in 2020.  2.  Severe degenerative disc disease of the spine and joints.  This significantly affects her quality of life.  3.  Renal insufficiency.  Plan: Once I receive the rest of her lab results, I will contact her.  I will see her back in 1 year with CBC, comprehensive metabolic profile, serum protein electrophoresis, serum light chains, and quantitative immunoglobulins.  She understands and agrees with this plan of care.   I provided *** minutes of face-to-face time during this this encounter and > 50% was spent counseling as documented under my assessment and plan.    Derwood Kaplan, MD [facility] [address] [place] [address] Dept: [phone] Dept Fax: [phone]   I, Rita Ohara, am acting as scribe for Derwood Kaplan, MD  I have reviewed this report as typed by the medical scribe, and it is complete and accurate.  Hermina Barters

## 2021-01-03 ENCOUNTER — Telehealth: Payer: Self-pay | Admitting: Hematology and Oncology

## 2021-01-03 ENCOUNTER — Other Ambulatory Visit: Payer: Self-pay

## 2021-01-03 ENCOUNTER — Encounter: Payer: Self-pay | Admitting: Hematology and Oncology

## 2021-01-03 ENCOUNTER — Inpatient Hospital Stay: Payer: Medicare Other | Attending: Oncology

## 2021-01-03 ENCOUNTER — Other Ambulatory Visit: Payer: Self-pay | Admitting: Hematology and Oncology

## 2021-01-03 ENCOUNTER — Other Ambulatory Visit: Payer: Self-pay | Admitting: Oncology

## 2021-01-03 ENCOUNTER — Inpatient Hospital Stay (INDEPENDENT_AMBULATORY_CARE_PROVIDER_SITE_OTHER): Payer: Medicare Other | Admitting: Hematology and Oncology

## 2021-01-03 VITALS — BP 142/84 | HR 95 | Temp 98.9°F | Resp 18 | Ht 60.0 in | Wt 143.9 lb

## 2021-01-03 DIAGNOSIS — D472 Monoclonal gammopathy: Secondary | ICD-10-CM | POA: Insufficient documentation

## 2021-01-03 DIAGNOSIS — Z79899 Other long term (current) drug therapy: Secondary | ICD-10-CM | POA: Insufficient documentation

## 2021-01-03 LAB — HEPATIC FUNCTION PANEL
ALT: 18 (ref 7–35)
AST: 28 (ref 13–35)
Alkaline Phosphatase: 89 (ref 25–125)
Bilirubin, Total: 0.5

## 2021-01-03 LAB — CBC AND DIFFERENTIAL
HCT: 36 (ref 36–46)
Hemoglobin: 11.7 — AB (ref 12.0–16.0)
Neutrophils Absolute: 2.29
Platelets: 245 (ref 150–399)
WBC: 3.7

## 2021-01-03 LAB — BASIC METABOLIC PANEL
BUN: 12 (ref 4–21)
CO2: 26 — AB (ref 13–22)
Chloride: 104 (ref 99–108)
Creatinine: 1.1 (ref 0.5–1.1)
Glucose: 116
Potassium: 4.2 (ref 3.4–5.3)
Sodium: 137 (ref 137–147)

## 2021-01-03 LAB — COMPREHENSIVE METABOLIC PANEL
Albumin: 4.1 (ref 3.5–5.0)
Calcium: 8.8 (ref 8.7–10.7)

## 2021-01-03 LAB — CBC: RBC: 3.42 — AB (ref 3.87–5.11)

## 2021-01-03 NOTE — Telephone Encounter (Signed)
Per 2/24 los next appt  sched and given to patient 

## 2021-01-03 NOTE — Progress Notes (Signed)
[facility]  [address] [place],  [place]  [address] [phone]  Clinic Day:  01/07/2021  Referring physician: Imagene Riches, NP   CHIEF COMPLAINT:  CC: A 65 year old female with a history of monoclonal gammopathy of uncertain significance here for annual evaluation.  Current Treatment:  Surveillance   HISTORY OF PRESENT ILLNESS:  Kendra Brewer is a 65 y.o. 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  This has been very stable.  She was not seen from January 2014 to September of 2016.  Unfortunately, she has chronic severe pain from severe degenerative disc and degenerative joint disease which limits her activity.  Her M-spike actually decreased in 2020 from 0.7 to 0.6.    INTERVAL HISTORY:  Kendra Brewer is here for annual evaluation. She has been well since her last visit other than chronic back pain that is being managed by the pain clinic. She denies fever, chills, nausea or vomiting. She denies issue with bowel or bladder. She denies shortness of breath, chest pain or cough. Her appetite is good. CBC and CMP are unremarkable today. M-spike is 0.7 today.  REVIEW OF SYSTEMS:  Review of Systems  Constitutional: Positive for fatigue. Negative for appetite change, chills, diaphoresis, fever and unexpected weight change.  HENT:   Negative for hearing loss, lump/mass, mouth sores, nosebleeds, sore throat, tinnitus, trouble swallowing and voice change.   Eyes: Negative for eye problems and icterus.  Respiratory: Negative for chest tightness, cough, hemoptysis, shortness of breath and wheezing.   Cardiovascular: Negative for chest pain, leg swelling and palpitations.  Gastrointestinal: Negative for abdominal distention, abdominal pain, blood in stool, constipation, diarrhea, nausea, rectal pain and vomiting.  Endocrine: Negative for hot flashes.  Genitourinary: Negative for bladder  incontinence, difficulty urinating, dyspareunia, dysuria, frequency, hematuria and nocturia.   Musculoskeletal: Positive for arthralgias, back pain, neck pain and neck stiffness. Negative for flank pain, gait problem and myalgias.       Chronic back and neck pain  Skin: Negative for itching, rash and wound.  Neurological: Negative for dizziness, extremity weakness, gait problem, headaches, light-headedness, numbness, seizures and speech difficulty.  Hematological: Negative for adenopathy. Does not bruise/bleed easily.  Psychiatric/Behavioral: Negative for confusion, decreased concentration, depression, sleep disturbance and suicidal ideas. The patient is not nervous/anxious.      VITALS:  Blood pressure (!) 142/84, pulse 95, temperature 98.9 F (37.2 C), temperature source Oral, resp. rate 18, height 5' (1.524 m), weight 143 lb 14.4 oz (65.3 kg), SpO2 92 %.  Wt Readings from Last 3 Encounters:  01/03/21 143 lb 14.4 oz (65.3 kg)    Body mass index is 28.1 kg/m.  Performance status (ECOG): 1 - Symptomatic but completely ambulatory  PHYSICAL EXAM:  Physical Exam Constitutional:      General: She is not in acute distress.    Appearance: Normal appearance. She is normal weight. She is not ill-appearing, toxic-appearing or diaphoretic.  HENT:     Head: Normocephalic and atraumatic.     Nose: Nose normal. No congestion or rhinorrhea.     Mouth/Throat:     Mouth: Mucous membranes are moist.     Pharynx: Oropharynx is clear. No oropharyngeal exudate or posterior oropharyngeal erythema.  Eyes:     General: No scleral icterus.       Right eye: No discharge.        Left eye: No discharge.     Extraocular Movements: Extraocular movements  intact.     Conjunctiva/sclera: Conjunctivae normal.     Pupils: Pupils are equal, round, and reactive to light.  Neck:     Vascular: No carotid bruit.  Cardiovascular:     Rate and Rhythm: Normal rate and regular rhythm.     Heart sounds: No murmur  heard. No friction rub. No gallop.   Pulmonary:     Effort: Pulmonary effort is normal. No respiratory distress.     Breath sounds: Normal breath sounds. No stridor. No wheezing, rhonchi or rales.  Chest:     Chest wall: No tenderness.  Abdominal:     General: Abdomen is flat. Bowel sounds are normal. There is no distension.     Palpations: There is no mass.     Tenderness: There is no abdominal tenderness. There is no right CVA tenderness, left CVA tenderness, guarding or rebound.     Hernia: No hernia is present.  Musculoskeletal:        General: No swelling, tenderness, deformity or signs of injury. Normal range of motion.     Cervical back: Normal range of motion and neck supple. No rigidity or tenderness.     Right lower leg: No edema.     Left lower leg: No edema.  Lymphadenopathy:     Cervical: No cervical adenopathy.  Skin:    General: Skin is warm and dry.     Capillary Refill: Capillary refill takes less than 2 seconds.     Coloration: Skin is not jaundiced or pale.     Findings: No bruising, erythema, lesion or rash.  Neurological:     General: No focal deficit present.     Mental Status: She is alert and oriented to person, place, and time. Mental status is at baseline.     Cranial Nerves: No cranial nerve deficit.     Sensory: No sensory deficit.     Motor: No weakness.     Coordination: Coordination normal.     Gait: Gait normal.     Deep Tendon Reflexes: Reflexes normal.  Psychiatric:        Mood and Affect: Mood normal.        Behavior: Behavior normal.        Thought Content: Thought content normal.        Judgment: Judgment normal.     LABS:   CBC Latest Ref Rng & Units 01/03/2021  WBC - 3.7  Hemoglobin 12.0 - 16.0 11.7(A)  Hematocrit 36 - 46 36  Platelets 150 - 399 245   CMP Latest Ref Rng & Units 01/03/2021  BUN 4 - 21 12  Creatinine 0.5 - 1.1 1.1  Sodium 137 - 147 137  Potassium 3.4 - 5.3 4.2  Chloride 99 - 108 104  CO2 13 - 22 26(A)  Calcium  8.7 - 10.7 8.8  Alkaline Phos 25 - 125 89  AST 13 - 35 28  ALT 7 - 35 18      Lab Results  Component Value Date   TOTALPROTELP 6.8 01/03/2021   ALBUMINELP 3.7 01/03/2021   A1GS 0.2 01/03/2021   A2GS 0.7 01/03/2021   BETS 0.9 01/03/2021   GAMS 1.3 01/03/2021   MSPIKE 0.7 (H) 01/03/2021   SPEI Comment 01/03/2021   No results found for: TIBC, FERRITIN, IRONPCTSAT No results found for: LDH   STUDIES:  No results found.   Allergies:  Allergies  Allergen Reactions  . Gabapentin Other (See Comments)  . Hydroxyzine   . Sulfa Antibiotics   .  Sulfamethoxazole Other (See Comments)    Headaches    Current Medications: Current Outpatient Medications  Medication Sig Dispense Refill  . ARIPiprazole (ABILIFY) 2 MG tablet     . Calcium Carb-Cholecalciferol (CALCIUM 500/D PO) Take by mouth.    . Cholecalciferol (VITAMIN D3) 1.25 MG (50000 UT) TABS Take by mouth.    . Flax Oil-Fish Oil-Borage Oil CAPS Take by mouth.    . ondansetron (ZOFRAN) 4 MG tablet Take by mouth.    . oxyCODONE (ROXICODONE) 15 MG immediate release tablet take 1 tablet by oral route  5 times a day as needed    . traZODone (DESYREL) 50 MG tablet Take one (1) tablet by mouth twice a day, as needed    . albuterol (VENTOLIN HFA) 108 (90 Base) MCG/ACT inhaler Inhale 2 puffs into the lungs every 6 (six) hours as needed.    Marland Kitchen buPROPion (WELLBUTRIN XL) 150 MG 24 hr tablet Take 150 mg by mouth every morning.    . citalopram (CELEXA) 20 MG tablet Take 20 mg by mouth 2 (two) times daily.    . cyanocobalamin (,VITAMIN B-12,) 1000 MCG/ML injection Inject 1,000 mcg into the muscle every 30 (thirty) days.    Marland Kitchen doxycycline (VIBRA-TABS) 100 MG tablet Take by mouth.    . rosuvastatin (CRESTOR) 5 MG tablet Take 5 mg by mouth daily.    . traZODone (DESYREL) 100 MG tablet TAKE 2 TABLETS BY MOUTH EVERY DAY AT BEDTIME     No current facility-administered medications for this visit.     ASSESSMENT & PLAN:   Assessment:   1.   Monoclonal gammopathy of uncertain significance, diagnosed in July 2007, with no evidence of progression. M-spike today is 0.7.  2.  Severe degenerative disc disease of the spine and joints.  This significantly affects her quality of life. She is being managed by the pain clinic.   Plan: We will see her back in one year with repeat CBC, CMP and SPEP.   She verbalizes understanding of and agreement to the plans discussed today. She knows to call the office should any new questions or concerns arise.    Melodye Ped, NP [facility] AT [facility] [address] [place] [address] Dept: [phone] Dept Fax: [phone]

## 2021-01-04 LAB — IGG, IGA, IGM
IgA: 293 mg/dL (ref 87–352)
IgG (Immunoglobin G), Serum: 1457 mg/dL (ref 586–1602)
IgM (Immunoglobulin M), Srm: 34 mg/dL (ref 26–217)

## 2021-01-04 LAB — KAPPA/LAMBDA LIGHT CHAINS
Kappa free light chain: 244.6 mg/L — ABNORMAL HIGH (ref 3.3–19.4)
Kappa, lambda light chain ratio: 13.98 — ABNORMAL HIGH (ref 0.26–1.65)
Lambda free light chains: 17.5 mg/L (ref 5.7–26.3)

## 2021-01-05 LAB — PROTEIN ELECTROPHORESIS, SERUM
A/G Ratio: 1.2 (ref 0.7–1.7)
Albumin ELP: 3.7 g/dL (ref 2.9–4.4)
Alpha-1-Globulin: 0.2 g/dL (ref 0.0–0.4)
Alpha-2-Globulin: 0.7 g/dL (ref 0.4–1.0)
Beta Globulin: 0.9 g/dL (ref 0.7–1.3)
Gamma Globulin: 1.3 g/dL (ref 0.4–1.8)
Globulin, Total: 3.1 g/dL (ref 2.2–3.9)
M-Spike, %: 0.7 g/dL — ABNORMAL HIGH
Total Protein ELP: 6.8 g/dL (ref 6.0–8.5)

## 2021-01-11 ENCOUNTER — Telehealth: Payer: Self-pay

## 2021-01-11 NOTE — Telephone Encounter (Deleted)
Pt states she had blood work done last week, and hasn't heard any results. [phone]

## 2021-01-11 NOTE — Telephone Encounter (Addendum)
I called pt, and notified her of below response from [place].    RE: Lab results from last week Received: Today Melodye Ped, NP  Dairl Ponder, RN Her M-spike was 0.7 and 0.6 before that, so no concern. Let me review her light chains from last time.            Pt states she had blood work done last week, and hasn't heard any results. [phone]

## 2021-04-04 ENCOUNTER — Other Ambulatory Visit: Payer: Self-pay | Admitting: Hematology and Oncology

## 2021-04-04 ENCOUNTER — Telehealth: Payer: Self-pay | Admitting: Hematology and Oncology

## 2021-04-04 DIAGNOSIS — Z13220 Encounter for screening for lipoid disorders: Secondary | ICD-10-CM

## 2021-04-04 DIAGNOSIS — D472 Monoclonal gammopathy: Secondary | ICD-10-CM

## 2021-04-04 DIAGNOSIS — D649 Anemia, unspecified: Secondary | ICD-10-CM

## 2021-04-04 NOTE — Telephone Encounter (Signed)
Received a CBC from the patient's primary care provider which revealed worsening anemia with a hemoglobin of 10.6, down from 11.73 months ago.  The MCV is elevated as well.  Additional testing with a CMP, TSH, B12, vitamin-D and lipid panel was ordered at the PCP office, but had to be canceled.  Her light chains in February were higher than previous.  I discussed this with Dr. Bobby Rumpf and due to the anemia, he would recommend a full re-evaluation with SPEP, QIG's, serum light chains, iron studies, B12, folate and TSH.  I will also add in the vitamin-D and lipid panel the could not be done at her primary care provider's office. I scheduled her for labs on May 31st. I will plan to see her back following week to review the results.  The patient verbalizes understanding.

## 2021-04-09 ENCOUNTER — Inpatient Hospital Stay: Payer: Medicare Other | Attending: Hematology and Oncology

## 2021-04-09 ENCOUNTER — Telehealth: Payer: Self-pay

## 2021-04-09 ENCOUNTER — Other Ambulatory Visit: Payer: Self-pay

## 2021-04-09 ENCOUNTER — Inpatient Hospital Stay: Payer: Medicare Other

## 2021-04-09 DIAGNOSIS — D472 Monoclonal gammopathy: Secondary | ICD-10-CM | POA: Insufficient documentation

## 2021-04-09 DIAGNOSIS — D649 Anemia, unspecified: Secondary | ICD-10-CM | POA: Diagnosis not present

## 2021-04-09 DIAGNOSIS — Z13220 Encounter for screening for lipoid disorders: Secondary | ICD-10-CM | POA: Diagnosis not present

## 2021-04-09 LAB — HEPATIC FUNCTION PANEL
ALT: 15 (ref 7–35)
AST: 26 (ref 13–35)
Alkaline Phosphatase: 117 (ref 25–125)
Bilirubin, Total: 0.9

## 2021-04-09 LAB — IRON AND TIBC
Iron: 67 ug/dL (ref 28–170)
Saturation Ratios: 20 % (ref 10.4–31.8)
TIBC: 335 ug/dL (ref 250–450)
UIBC: 268 ug/dL

## 2021-04-09 LAB — FERRITIN: Ferritin: 94 ng/mL (ref 11–307)

## 2021-04-09 LAB — BASIC METABOLIC PANEL
BUN: 12 (ref 4–21)
CO2: 26 — AB (ref 13–22)
Chloride: 103 (ref 99–108)
Creatinine: 1.2 — AB (ref 0.5–1.1)
Glucose: 116
Potassium: 4.1 (ref 3.4–5.3)
Sodium: 138 (ref 137–147)

## 2021-04-09 LAB — CBC: RBC: 3.18 — AB (ref 3.87–5.11)

## 2021-04-09 LAB — TSH: TSH: 2.221 u[IU]/mL (ref 0.350–4.500)

## 2021-04-09 LAB — COMPREHENSIVE METABOLIC PANEL
Albumin: 3.9 (ref 3.5–5.0)
Calcium: 8.7 (ref 8.7–10.7)

## 2021-04-09 LAB — LIPID PANEL
Cholesterol: 118 mg/dL (ref 0–200)
HDL: 28 mg/dL — ABNORMAL LOW (ref >40)
LDL Cholesterol: 75 mg/dL (ref 0–99)
Total CHOL/HDL Ratio: 4.2 RATIO
Triglycerides: 74 mg/dL (ref <150)
VLDL: 15 mg/dL (ref 0–40)

## 2021-04-09 LAB — CBC AND DIFFERENTIAL
HCT: 33 — AB (ref 36–46)
Hemoglobin: 10.9 — AB (ref 12.0–16.0)
Neutrophils Absolute: 2.31
Platelets: 280 (ref 150–399)
WBC: 3.5

## 2021-04-09 LAB — FOLATE: Folate: 10.4 ng/mL (ref >5.9)

## 2021-04-09 LAB — VITAMIN B12: Vitamin B-12: 815 pg/mL (ref 180–914)

## 2021-04-09 NOTE — Telephone Encounter (Signed)
Pt LVM on nurse triage line that she was running later for her 3pm lab appt. Is it ok to come on?  I attempted call back to pt, went straight to voicemail.  I spoke with pt & told her to come on in, but must be by 4p or 415p. I then notified Shay.

## 2021-04-10 ENCOUNTER — Encounter: Payer: Self-pay | Admitting: Hematology and Oncology

## 2021-04-10 LAB — KAPPA/LAMBDA LIGHT CHAINS
Kappa free light chain: 280.9 mg/L — ABNORMAL HIGH (ref 3.3–19.4)
Kappa, lambda light chain ratio: 11.37 — ABNORMAL HIGH (ref 0.26–1.65)
Lambda free light chains: 24.7 mg/L (ref 5.7–26.3)

## 2021-04-10 LAB — VITAMIN D 25 HYDROXY (VIT D DEFICIENCY, FRACTURES): Vit D, 25-Hydroxy: 45.86 ng/mL (ref 30–100)

## 2021-04-11 LAB — MULTIPLE MYELOMA PANEL, SERUM
Albumin SerPl Elph-Mcnc: 3.3 g/dL (ref 2.9–4.4)
Albumin/Glob SerPl: 1 (ref 0.7–1.7)
Alpha 1: 0.3 g/dL (ref 0.0–0.4)
Alpha2 Glob SerPl Elph-Mcnc: 0.8 g/dL (ref 0.4–1.0)
B-Globulin SerPl Elph-Mcnc: 1 g/dL (ref 0.7–1.3)
Gamma Glob SerPl Elph-Mcnc: 1.5 g/dL (ref 0.4–1.8)
Globulin, Total: 3.6 g/dL (ref 2.2–3.9)
IgA: 373 mg/dL — ABNORMAL HIGH (ref 87–352)
IgG (Immunoglobin G), Serum: 1651 mg/dL — ABNORMAL HIGH (ref 586–1602)
IgM (Immunoglobulin M), Srm: 33 mg/dL (ref 26–217)
M Protein SerPl Elph-Mcnc: 0.9 g/dL — ABNORMAL HIGH
Total Protein ELP: 6.9 g/dL (ref 6.0–8.5)

## 2021-04-17 ENCOUNTER — Encounter: Payer: Self-pay | Admitting: Hematology and Oncology

## 2021-04-17 ENCOUNTER — Inpatient Hospital Stay: Payer: Medicare Other | Attending: Hematology and Oncology | Admitting: Hematology and Oncology

## 2021-04-17 ENCOUNTER — Other Ambulatory Visit: Payer: Self-pay

## 2021-04-17 VITALS — BP 136/77 | HR 95 | Temp 98.8°F | Resp 20 | Ht 60.0 in | Wt 143.7 lb

## 2021-04-17 DIAGNOSIS — D472 Monoclonal gammopathy: Secondary | ICD-10-CM | POA: Insufficient documentation

## 2021-04-17 DIAGNOSIS — G8929 Other chronic pain: Secondary | ICD-10-CM | POA: Insufficient documentation

## 2021-04-17 DIAGNOSIS — F17290 Nicotine dependence, other tobacco product, uncomplicated: Secondary | ICD-10-CM | POA: Insufficient documentation

## 2021-04-17 DIAGNOSIS — F32A Depression, unspecified: Secondary | ICD-10-CM | POA: Insufficient documentation

## 2021-04-17 DIAGNOSIS — D649 Anemia, unspecified: Secondary | ICD-10-CM | POA: Insufficient documentation

## 2021-04-17 DIAGNOSIS — Z79899 Other long term (current) drug therapy: Secondary | ICD-10-CM | POA: Insufficient documentation

## 2021-04-17 DIAGNOSIS — E785 Hyperlipidemia, unspecified: Secondary | ICD-10-CM | POA: Insufficient documentation

## 2021-04-17 DIAGNOSIS — L03116 Cellulitis of left lower limb: Secondary | ICD-10-CM | POA: Insufficient documentation

## 2021-04-17 DIAGNOSIS — I129 Hypertensive chronic kidney disease with stage 1 through stage 4 chronic kidney disease, or unspecified chronic kidney disease: Secondary | ICD-10-CM | POA: Insufficient documentation

## 2021-04-17 NOTE — Progress Notes (Signed)
[facility]  [address] [place],  [place]  [address] [phone]  Clinic Day:  04/17/2021  Referring physician: Imagene Riches, NP   CHIEF COMPLAINT:  CC:  Monoclonal gammopathy of uncertain significance with new anemia  Current Treatment:   Repeat evaluation   HISTORY OF PRESENT ILLNESS:  Kendra Brewer is a 65 y.o. 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  This has been very stable.  She was not seen from January 2014 to September of 2016. Unfortunately, she has chronic severe pain from severe degenerative disc and degenerative joint disease which limits her activity.  Her M-spike has remained stable over the years.  She has had mild chronic kidney disease since January 2014 with fluctuation of her creatinine. She also has had intermittent mild anemia since 2012.  She was seen in February of this year and at that time, her M spike was stable at 0.7.  There was an increase in the kappa light chains and elevated kappa/lambda ratio.  Quantitative immunoglobulins were normal.  She had mild anemia with a hemoglobin 11.7.  Her kidney function was fairly stable with a creatinine of 1.2.  INTERVAL HISTORY:  Kendra Brewer is here today for repeat clinical assessment as her primary care provider forwarded labs from their office showing that she had worsening anemia. She denies fevers or chills. She  has chronic severe pain, which is stable. She reports depression which is well controlled with medication, but she has been more anxious lately awaiting the results of recent blood work. Her appetite is good. Her weight has been stable.  Her sister has recently been diagnosed with amyloidosis and is undergoing therapy. She has a family history of multiple myeloma in a maternal uncle as well.  REVIEW OF SYSTEMS:  Review of Systems  Constitutional: Positive for appetite change (Appetite  poor). Negative for chills, fatigue, fever and unexpected weight change.  HENT:   Negative for lump/mass, mouth sores and sore throat.   Respiratory: Negative for cough and shortness of breath.   Cardiovascular: Negative for chest pain and leg swelling.  Gastrointestinal: Negative for abdominal pain, constipation, diarrhea, nausea and vomiting.  Endocrine: Negative for hot flashes.  Genitourinary: Negative for difficulty urinating, dysuria, frequency and hematuria.   Musculoskeletal: Positive for back pain (Severe, stable). Negative for arthralgias and myalgias.  Skin: Negative for rash.  Neurological: Negative for dizziness and headaches.  Hematological: Negative for adenopathy. Does not bruise/bleed easily.  Psychiatric/Behavioral: Negative for depression and sleep disturbance. The patient is nervous/anxious (While awaiting blood test results, also due to concern for her sister).    VITALS:  Blood pressure 136/77, pulse 95, temperature 98.8 F (37.1 C), temperature source Oral, resp. rate 20, height 5' (1.524 m), weight 143 lb 11.2 oz (65.2 kg), SpO2 93 %.  Wt Readings from Last 3 Encounters:  04/17/21 143 lb 11.2 oz (65.2 kg)  01/03/21 143 lb 14.4 oz (65.3 kg)    Body mass index is 28.06 kg/m.  Performance status (ECOG): 2 - Symptomatic, <50% confined to bed  PHYSICAL EXAM:  Physical Exam Vitals and nursing note reviewed.  Constitutional:      General: She is not in acute distress.    Appearance: Normal appearance.  HENT:     Head: Normocephalic and atraumatic.     Mouth/Throat:     Mouth: Mucous membranes are moist.     Pharynx: Oropharynx is clear. No  oropharyngeal exudate or posterior oropharyngeal erythema.  Eyes:     General: No scleral icterus.    Extraocular Movements: Extraocular movements intact.     Conjunctiva/sclera: Conjunctivae normal.     Pupils: Pupils are equal, round, and reactive to light.  Cardiovascular:     Rate and Rhythm: Normal rate and regular  rhythm.     Heart sounds: Normal heart sounds. No murmur heard. No friction rub. No gallop.   Pulmonary:     Effort: Pulmonary effort is normal.     Breath sounds: Normal breath sounds. No wheezing, rhonchi or rales.  Chest:  Breasts:     Right: No axillary adenopathy or supraclavicular adenopathy.     Left: No axillary adenopathy or supraclavicular adenopathy.    Abdominal:     General: There is no distension.     Palpations: Abdomen is soft. There is no hepatomegaly, splenomegaly or mass.     Tenderness: There is no abdominal tenderness.  Musculoskeletal:        General: Normal range of motion.     Cervical back: Normal range of motion and neck supple. No tenderness.     Right lower leg: No edema.     Left lower leg: No edema.  Lymphadenopathy:     Cervical: No cervical adenopathy.     Upper Body:     Right upper body: No supraclavicular or axillary adenopathy.     Left upper body: No supraclavicular or axillary adenopathy.     Lower Body: No right inguinal adenopathy. No left inguinal adenopathy.  Skin:    General: Skin is warm and dry.     Coloration: Skin is not jaundiced.     Findings: No rash.  Neurological:     Mental Status: She is alert and oriented to person, place, and time.     Cranial Nerves: No cranial nerve deficit.  Psychiatric:        Mood and Affect: Mood normal.        Behavior: Behavior normal.        Thought Content: Thought content normal.    LABS:   CBC Latest Ref Rng & Units 04/09/2021 01/03/2021  WBC - 3.5 3.7  Hemoglobin 12.0 - 16.0 10.9(A) 11.7(A)  Hematocrit 36 - 46 33(A) 36  Platelets 150 - 399 280 245   CMP Latest Ref Rng & Units 04/09/2021 01/03/2021  BUN 4 - 21 12 12   Creatinine 0.5 - 1.1 1.2(A) 1.1  Sodium 137 - 147 138 137  Potassium 3.4 - 5.3 4.1 4.2  Chloride 99 - 108 103 104  CO2 13 - 22 26(A) 26(A)  Calcium 8.7 - 10.7 8.7 8.8  Alkaline Phos 25 - 125 117 89  AST 13 - 35 26 28  ALT 7 - 35 15 18     No results found for:  CEA1 / No results found for: CEA1 No results found for: PSA1 No results found for: CAN199 No results found for: YYQ825  Lab Results  Component Value Date   TOTALPROTELP 6.9 04/09/2021   ALBUMINELP 3.7 01/03/2021   A1GS 0.2 01/03/2021   A2GS 0.7 01/03/2021   BETS 0.9 01/03/2021   GAMS 1.3 01/03/2021   MSPIKE 0.7 (H) 01/03/2021   SPEI Comment 01/03/2021   Lab Results  Component Value Date   TIBC 335 04/09/2021   FERRITIN 94 04/09/2021   IRONPCTSAT 20 04/09/2021   No results found for: LDH    Kappa/lambda light chains Order: [id number] Status: Final result  Visible to patient: No (inaccessible in MyChart)   Next appt: Today at 03:00 PM in Oncology [facility] A Aviraj Kentner, PA-C)   Dx: Monoclonal gammopathy of undetermined...   0 Result Notes  Component Ref Range & Units 8 d ago 3 mo ago  Kappa free light chain 3.3 - 19.4 mg/L 280.9 High   244.6 High    Lambda free light chains 5.7 - 26.3 mg/L 24.7  17.5   Kappa, lambda light chain ratio 0.26 - 1.65 11.37 High   13.98 High  CM   Comment: (NOTE)  Performed At: [facility]  [place], [place] [id number]  Rush Farmer MD GG:[id number]   Resulting Agency  [facility] CLIN LAB [facility]         Specimen Collected: 04/09/21 16:06 Last Resulted: 04/10/21 17:35        Multiple Myeloma Panel (SPEP&IFE w/QIG) Order: [id number] Status: Edited Result - FINAL   Visible to patient: No (inaccessible in MyChart)   Next appt: Today at 03:00 PM in Oncology [facility] A Akiah Bauch, PA-C)   Dx: Monoclonal gammopathy of undetermined...   0 Result Notes  Component Ref Range & Units 8 d ago  (04/09/21) 3 mo ago  (01/03/21) 3 mo ago  (01/03/21)  IgG (Immunoglobin G), Serum 586 - 1,602 mg/dL 1,651 High    1,457   IgA 87 - 352 mg/dL 373 High    293   IgM (Immunoglobulin M), Srm 26 - 217 mg/dL 33   34 CM   Total Protein ELP 6.0 - 8.5 g/dL 6.9 VC  6.8    Albumin SerPl Elph-Mcnc 2.9 - 4.4 g/dL 3.3 VC     Alpha 1 0.0 - 0.4 g/dL 0.3 VC      Alpha2 Glob SerPl Elph-Mcnc 0.4 - 1.0 g/dL 0.8 VC     B-Globulin SerPl Elph-Mcnc 0.7 - 1.3 g/dL 1.0 VC     Gamma Glob SerPl Elph-Mcnc 0.4 - 1.8 g/dL 1.5 VC     M Protein SerPl Elph-Mcnc Not Observed g/dL 0.9 High  VC     Globulin, Total 2.2 - 3.9 g/dL 3.6 VC  3.1 VC    Albumin/Glob SerPl 0.7 - 1.7 1.0 VC     IFE 1  Comment Abnormal  VC     Comment: (NOTE)  Immunofixation shows IgG monoclonal protein with kappa light chain  specificity.  Please note that samples from patients receiving DARZALEX(R)  (daratumumab) or SARCLISA(R)(isatuximab-irfc) treatment can appear  as an "IgG kappa" and mask a complete response (CR). If this  patient is receiving these therapies, this IFE assay interference  can be removed by ordering test number [id number]-"Immunofixation,  Daratumumab-Specific, Serum" or [id number]-"Immunofixation,  Isatuximab-Specific, Serum" and submitting a new sample for  testing or by calling the lab to add this test to the current  sample.   Please Note  Comment VC     Comment: (NOTE)  Protein electrophoresis scan will follow via computer, mail, or  courier delivery.  Performed At: [facility]  [place], [place] [id number]  Rush Farmer MD YF:[id number]      STUDIES:  No results found.    HISTORY:   Past Medical History:  Diagnosis Date   Depression    Essential hypertension    Hyperlipidemia     Past Surgical History:  Procedure Laterality Date   BACK SURGERY      History reviewed. No pertinent family history.  Social History:  reports that she has been smoking e-cigarettes.  She has never used smokeless tobacco. No history on file for alcohol use and drug use.The patient is alone today.  Allergies:  Allergies  Allergen Reactions   Gabapentin Other (See Comments)   Hydroxyzine     Other reaction(s): Other (See Comments)   Misc. Sulfonamide Containing Compounds    Sulfa Antibiotics    Sulfamethoxazole Other (See Comments)     Headaches    Current Medications: Current Outpatient Medications  Medication Sig Dispense Refill   albuterol (VENTOLIN HFA) 108 (90 Base) MCG/ACT inhaler Inhale 2 puffs into the lungs every 6 (six) hours as needed.     ARIPiprazole (ABILIFY) 2 MG tablet      buPROPion (WELLBUTRIN XL) 150 MG 24 hr tablet Take 150 mg by mouth every morning.     Calcium Carb-Cholecalciferol (CALCIUM 500/D PO) Take by mouth.     Cholecalciferol (VITAMIN D3) 1.25 MG (50000 UT) TABS Take by mouth.     citalopram (CELEXA) 20 MG tablet Take 20 mg by mouth 2 (two) times daily.     cyanocobalamin (,VITAMIN B-12,) 1000 MCG/ML injection Inject 1,000 mcg into the muscle every 30 (thirty) days.     doxycycline (VIBRA-TABS) 100 MG tablet Take by mouth.     Flax Oil-Fish Oil-Borage Oil CAPS Take by mouth.     naloxone (NARCAN) nasal spray 4 mg/0.1 mL as directed.     ondansetron (ZOFRAN) 4 MG tablet Take by mouth.     oxyCODONE (ROXICODONE) 15 MG immediate release tablet take 1 tablet by oral route  5 times a day as needed     rosuvastatin (CRESTOR) 5 MG tablet Take 5 mg by mouth daily.     traZODone (DESYREL) 100 MG tablet TAKE 2 TABLETS BY MOUTH EVERY DAY AT BEDTIME     traZODone (DESYREL) 50 MG tablet Take one (1) tablet by mouth twice a day, as needed     No current facility-administered medications for this visit.     ASSESSMENT & PLAN:   Assessment:   1.  Monoclonal gammopathy of uncertain significance, diagnosed in July 2007. She has had increase in her M spike and serum IgG, as well as worsening anemia, which is concerning for possible progression to multiple myeloma.  After discussion with Dr. Bobby Rumpf, we will proceed with a bone marrow evaluation with cytogenics and myeloma FISH panel.   2.  Severe degenerative disc disease of the spine and joints.  This significantly affects her quality of life. She is being managed by the pain clinic.    Plan: I will schedule her for bone marrow biopsy in Interventional  Radiology with cytogenetics and myeloma FISH panel.  We will have her see one of the covering physicians for the results. The patient understands the plans discussed today and is in agreement with them.  She knows to contact our office if she develops concerns prior to her next appointment.      Marvia Pickles, PA-C

## 2021-04-18 ENCOUNTER — Telehealth: Payer: Self-pay | Admitting: Hematology and Oncology

## 2021-04-18 NOTE — Telephone Encounter (Signed)
6*9/22 Spoke with patient and schedule bone marrow biopsy.Patient confirmed appt. 04/26/21 arrive at 830am.

## 2021-04-25 ENCOUNTER — Telehealth: Payer: Self-pay

## 2021-04-25 ENCOUNTER — Telehealth: Payer: Self-pay | Admitting: Hematology and Oncology

## 2021-04-25 NOTE — Telephone Encounter (Signed)
Patient notified of rescheduled 6/24 Bone Marrow Biopsy.  Gave instructions  Also, patient is scheduled for 6/29 Dr Alvy Bimler to Follow Up

## 2021-04-25 NOTE — Telephone Encounter (Addendum)
Pt notified that scheduling will be calling her with new appt. ----- Message from Marvia Pickles, PA-C sent at 04/25/2021  2:49 PM EDT ----- Regarding: RE: Bone marrow bx cancelled Contact: [phone] I sent Pamala Hurry a chat yesterday asking her to cancel Friday because you forwarded the earlier voicemail message to me. I do recommend she r/s BM and then f/u with one of the covering physicians asap. ----- Message ----- From: Dairl Ponder, RN Sent: 04/25/2021   2:45 PM EDT To: Marvia Pickles, PA-C Subject: Bone marrow bx cancelled                       Pt called today & wanted to tell us she was discharged from the hospital. She is asking when the bone marrow biopsy is scheduled, to have conscious sedation. She states, "They have poked and prodded every part of my body in the hospital and  I need a break" I told her we cancelled the bone marrow bx that was originally scheduled for tomorrow. So do you want Langley Gauss to go ahead and get t scheduled with IR, & then do f/u? Right now, she just has Feb 2023 appt.

## 2021-05-03 ENCOUNTER — Encounter: Payer: Self-pay | Admitting: Hematology and Oncology

## 2021-05-03 ENCOUNTER — Other Ambulatory Visit: Payer: Self-pay

## 2021-05-03 ENCOUNTER — Inpatient Hospital Stay (INDEPENDENT_AMBULATORY_CARE_PROVIDER_SITE_OTHER): Payer: Medicare Other | Admitting: Hematology and Oncology

## 2021-05-03 ENCOUNTER — Inpatient Hospital Stay: Payer: Medicare Other

## 2021-05-03 ENCOUNTER — Other Ambulatory Visit: Payer: Self-pay | Admitting: Oncology

## 2021-05-03 ENCOUNTER — Other Ambulatory Visit: Payer: Self-pay | Admitting: Hematology and Oncology

## 2021-05-03 VITALS — BP 118/83 | HR 93 | Temp 98.9°F | Resp 18 | Ht 60.0 in | Wt 146.9 lb

## 2021-05-03 VITALS — BP 103/75 | HR 91 | Temp 99.5°F | Resp 18 | Ht 60.0 in | Wt 146.0 lb

## 2021-05-03 DIAGNOSIS — G8929 Other chronic pain: Secondary | ICD-10-CM | POA: Diagnosis not present

## 2021-05-03 DIAGNOSIS — Z79899 Other long term (current) drug therapy: Secondary | ICD-10-CM | POA: Diagnosis not present

## 2021-05-03 DIAGNOSIS — F17290 Nicotine dependence, other tobacco product, uncomplicated: Secondary | ICD-10-CM | POA: Diagnosis not present

## 2021-05-03 DIAGNOSIS — I129 Hypertensive chronic kidney disease with stage 1 through stage 4 chronic kidney disease, or unspecified chronic kidney disease: Secondary | ICD-10-CM | POA: Diagnosis not present

## 2021-05-03 DIAGNOSIS — D649 Anemia, unspecified: Secondary | ICD-10-CM | POA: Diagnosis not present

## 2021-05-03 DIAGNOSIS — E785 Hyperlipidemia, unspecified: Secondary | ICD-10-CM | POA: Diagnosis not present

## 2021-05-03 DIAGNOSIS — D472 Monoclonal gammopathy: Secondary | ICD-10-CM | POA: Diagnosis not present

## 2021-05-03 DIAGNOSIS — F32A Depression, unspecified: Secondary | ICD-10-CM | POA: Diagnosis not present

## 2021-05-03 DIAGNOSIS — L03116 Cellulitis of left lower limb: Secondary | ICD-10-CM | POA: Diagnosis not present

## 2021-05-03 LAB — BASIC METABOLIC PANEL
BUN: 20 (ref 4–21)
CO2: 23 — AB (ref 13–22)
Chloride: 101 (ref 99–108)
Creatinine: 1.2 — AB (ref 0.5–1.1)
Glucose: 143
Potassium: 4.1 (ref 3.4–5.3)
Sodium: 134 — AB (ref 137–147)

## 2021-05-03 LAB — HEPATIC FUNCTION PANEL
ALT: 22 (ref 7–35)
AST: 29 (ref 13–35)
Alkaline Phosphatase: 107 (ref 25–125)
Bilirubin, Total: 0.9

## 2021-05-03 LAB — COMPREHENSIVE METABOLIC PANEL
Albumin: 3.8 (ref 3.5–5.0)
Calcium: 8.5 — AB (ref 8.7–10.7)

## 2021-05-03 LAB — CBC AND DIFFERENTIAL
HCT: 33 — AB (ref 36–46)
Hemoglobin: 10.9 — AB (ref 12.0–16.0)
Neutrophils Absolute: 5.62
Platelets: 258 (ref 150–399)
WBC: 7.3

## 2021-05-03 LAB — CBC: RBC: 3.09 — AB (ref 3.87–5.11)

## 2021-05-03 MED ORDER — CEFTRIAXONE SODIUM 1 G IJ SOLR
INTRAMUSCULAR | Status: AC
  Filled 2021-05-03: qty 20

## 2021-05-03 MED ORDER — LIDOCAINE HCL (PF) 1 % IJ SOLN
INTRAMUSCULAR | Status: AC
  Filled 2021-05-03: qty 5

## 2021-05-03 MED ORDER — CEFTRIAXONE SODIUM 1 G IJ SOLR
2.0000 g | Freq: Once | INTRAMUSCULAR | Status: AC
  Administered 2021-05-03: 2 g via INTRAMUSCULAR

## 2021-05-03 MED ORDER — CEPHALEXIN 500 MG PO CAPS: 500.0000 mg | ORAL_CAPSULE | Freq: Four times a day (QID) | ORAL | 0 refills | Status: AC

## 2021-05-03 NOTE — Progress Notes (Signed)
[place]  [address] [place],  [place]  [address] [phone]  Clinic Day:  05/03/2021  Referring physician: Imagene Riches, NP   CHIEF COMPLAINT:  CC:  Monoclonal gammopathy of uncertain significance with new anemia  Current Treatment:   Repeat evaluation   HISTORY OF PRESENT ILLNESS:  Kendra Brewer is a 65 y.o. 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  This has been very stable.  She was not seen from January 2014 to September of 2016. Unfortunately, she has chronic severe pain from severe degenerative disc and degenerative joint disease which limits her activity.  Her M-spike has remained stable over the years.  She has had mild chronic kidney disease since January 2014 with fluctuation of her creatinine. She also has had intermittent mild anemia since 2012.  She was seen in February of this year and at that time, her M spike was stable at 0.7.  There was an increase in the kappa light chains and elevated kappa/lambda ratio.  Quantitative immunoglobulins were normal.  She had mild anemia with a hemoglobin 11.7.  Her kidney function was fairly stable with a creatinine of 1.2.  Labs from her primary care provider in May revealed worsening anemia and mild renal insufficiency, so I brought her back in for repeat clinical assessment.  She had further increase in her serum light chains, IgG and M spike at that time, so we recommended bone marrow biopsy.  INTERVAL HISTORY:  Kendra Brewer is added to the schedule today as she was in Interventional Radiology for her bone marrow biopsy and  They contacted [place] to see her for swelling and redness of her left lower extremity surrounding a wound. She denies fevers or chills. She  has chronic severe pain, which is stable. She reports depression which is well controlled with medication, but she has been more anxious lately awaiting the  results of recent blood work. Her appetite is good. Her weight has been stable. Since her last visit, she was hospitalized in [facility] for syncope and dyspnea. She had an extensive workup, but does not really understand the root of the problem.  REVIEW OF SYSTEMS:  Review of Systems  Constitutional:  Positive for appetite change (Appetite poor). Negative for chills, fatigue, fever and unexpected weight change.  HENT:   Negative for lump/mass, mouth sores and sore throat.   Respiratory:  Negative for cough and shortness of breath.   Cardiovascular:  Negative for chest pain and leg swelling.  Gastrointestinal:  Negative for abdominal pain, constipation, diarrhea, nausea and vomiting.  Genitourinary:  Negative for difficulty urinating, dysuria, frequency and hematuria.   Musculoskeletal:  Positive for back pain (chronic, stable). Negative for arthralgias and myalgias.  Skin:  Positive for wound (left lower extremity). Negative for rash.  Neurological:  Positive for dizziness.  Hematological:  Negative for adenopathy. Does not bruise/bleed easily.  Psychiatric/Behavioral:  Negative for depression and sleep disturbance. The patient is not nervous/anxious.   VITALS:  Blood pressure 118/83, pulse 93, temperature 98.9 F (37.2 C), temperature source Oral, resp. rate 18, height 5' (1.524 m), weight 146 lb 14.4 oz (66.6 kg), SpO2 96 %.  Wt Readings from Last 3 Encounters:  05/03/21 146 lb (66.2 kg)  05/03/21 146 lb 14.4 oz (66.6 kg)  04/17/21 143 lb 11.2 oz (65.2 kg)    Body mass index is 28.69 kg/m.  Performance status (ECOG): 2 - Symptomatic, <50%  confined to bed  PHYSICAL EXAM:  Physical Exam Vitals and nursing note reviewed.  Constitutional:      General: She is not in acute distress.    Appearance: Normal appearance.  Cardiovascular:     Rate and Rhythm: Normal rate and regular rhythm.     Heart sounds: Normal heart sounds. No murmur heard.   No friction rub. No gallop.  Pulmonary:      Effort: Pulmonary effort is normal.     Breath sounds: Normal breath sounds. No wheezing, rhonchi or rales.  Chest:  Breasts:    Right: No axillary adenopathy or supraclavicular adenopathy.     Left: No axillary adenopathy or supraclavicular adenopathy.  Abdominal:     Palpations: There is no hepatomegaly or splenomegaly.  Musculoskeletal:        General: Tenderness (tenderness of distal left lower extremity area of wound and redness) present. Normal range of motion.     Right lower leg: No edema.     Left lower leg: Edema (1-2+) present.  Lymphadenopathy:     Upper Body:     Right upper body: No supraclavicular or axillary adenopathy.     Left upper body: No supraclavicular or axillary adenopathy.     Lower Body: No right inguinal adenopathy. No left inguinal adenopathy.  Skin:    General: Skin is warm and dry.     Coloration: Skin is not jaundiced.     Findings: Erythema (erythema surrounding the wound of the left lower extremity with overlying warmth) and lesion (wound of the distal right lower extremity with overlying eschar) present. No rash.  Neurological:     Mental Status: She is alert and oriented to person, place, and time.     Cranial Nerves: No cranial nerve deficit.  Psychiatric:        Mood and Affect: Mood normal.        Behavior: Behavior normal.        Thought Content: Thought content normal.   LABS:   CBC Latest Ref Rng & Units 05/03/2021 04/09/2021 01/03/2021  WBC - 7.3 3.5 3.7  Hemoglobin 12.0 - 16.0 10.9(A) 10.9(A) 11.7(A)  Hematocrit 36 - 46 33(A) 33(A) 36  Platelets 150 - 399 258 280 245   CMP Latest Ref Rng & Units 05/03/2021 04/09/2021 01/03/2021  BUN 4 - _0 Creatinine 0.5 - 1.1 1.2(A) 1.2(A) 1.1  Sodium 137 - 147 134(A) 138 137  Potassium 3.4 - 5.3 4.1 4.1 4.2  Chloride 99 - 108 101 103 104  CO2 13 - 22 23(A) 26(A) 26(A)  Calcium 8.7 - 10.7 8.5(A) 8.7 8.8  Alkaline Phos 25 - 125 107 117 89  AST 13 - 35 _1 ALT 7 - 35 _2 No results found for: CEA1 / No results found for: CEA1 No results found for: PSA1 No results found for: UEK800 No results found for: LKJ179  Lab Results  Component Value Date   TOTALPROTELP 6.9 04/09/2021   ALBUMINELP 3.7 01/03/2021   A1GS 0.2 01/03/2021   A2GS 0.7 01/03/2021   BETS 0.9 01/03/2021   GAMS 1.3 01/03/2021   MSPIKE 0.7 (H) 01/03/2021   SPEI Comment 01/03/2021   Lab Results  Component Value Date   TIBC 335 04/09/2021   FERRITIN 94 04/09/2021   IRONPCTSAT 20 04/09/2021   No results found for: LDH    Kappa/lambda light chains Order: [id number] Status: Final result   Visible to  patient: No (inaccessible in MyChart)   Next appt: Today at 03:00 PM in Oncology [facility] A Kendra Cederberg, PA-C)   Dx: Monoclonal gammopathy of undetermined...   0 Result Notes  Component Ref Range & Units 8 d ago 3 mo ago  Kappa free light chain 3.3 - 19.4 mg/L 280.9 High   244.6 High    Lambda free light chains 5.7 - 26.3 mg/L 24.7  17.5   Kappa, lambda light chain ratio 0.26 - 1.65 11.37 High   13.98 High  CM   Comment: (NOTE)  Performed At: [facility]  [place], [place] [id number]  Rush Farmer MD BL:[id number]   Resulting Agency  [facility]         Specimen Collected: 04/09/21 16:06 Last Resulted: 04/10/21 17:35        Multiple Myeloma Panel (SPEP&IFE w/QIG) Order: [id number] Status: Edited Result - FINAL   Visible to patient: No (inaccessible in MyChart)   Next appt: Today at 03:00 PM in Oncology [facility] A Talyah Seder, PA-C)   Dx: Monoclonal gammopathy of undetermined...   0 Result Notes  Component Ref Range & Units 8 d ago  (04/09/21) 3 mo ago  (01/03/21) 3 mo ago  (01/03/21)  IgG (Immunoglobin G), Serum 586 - 1,602 mg/dL 1,651 High    1,457   IgA 87 - 352 mg/dL 373 High    293   IgM (Immunoglobulin M), Srm 26 - 217 mg/dL 33   34 CM   Total Protein ELP 6.0 - 8.5 g/dL 6.9 VC  6.8    Albumin SerPl Elph-Mcnc 2.9 - 4.4 g/dL 3.3 VC      Alpha 1 0.0 - 0.4 g/dL 0.3 VC     Alpha2 Glob SerPl Elph-Mcnc 0.4 - 1.0 g/dL 0.8 VC     B-Globulin SerPl Elph-Mcnc 0.7 - 1.3 g/dL 1.0 VC     Gamma Glob SerPl Elph-Mcnc 0.4 - 1.8 g/dL 1.5 VC     M Protein SerPl Elph-Mcnc Not Observed g/dL 0.9 High  VC     Globulin, Total 2.2 - 3.9 g/dL 3.6 VC  3.1 VC    Albumin/Glob SerPl 0.7 - 1.7 1.0 VC     IFE 1  Comment Abnormal  VC     Comment: (NOTE)  Immunofixation shows IgG monoclonal protein with kappa light chain  specificity.  Please note that samples from patients receiving DARZALEX(R)  (daratumumab) or SARCLISA(R)(isatuximab-irfc) treatment can appear  as an "IgG kappa" and mask a complete response (CR). If this  patient is receiving these therapies, this IFE assay interference  can be removed by ordering test number [id number]-"Immunofixation,  Daratumumab-Specific, Serum" or [id number]-"Immunofixation,  Isatuximab-Specific, Serum" and submitting a new sample for  testing or by calling the lab to add this test to the current  sample.   Please Note  Comment VC     Comment: (NOTE)  Protein electrophoresis scan will follow via computer, mail, or  courier delivery.  Performed At: [facility]  [place], [place] [id number]  Rush Farmer MD SL:[id number]      STUDIES:  No results found.    HISTORY:   Past Medical History:  Diagnosis Date   Depression    Essential hypertension    Hyperlipidemia     Past Surgical History:  Procedure Laterality Date   BACK SURGERY      History reviewed. No pertinent family history.  Social History:  reports that she has been smoking e-cigarettes. She has  never used smokeless tobacco. No history on file for alcohol use and drug use.The patient is alone today.  Allergies:  Allergies  Allergen Reactions   Gabapentin Other (See Comments)   Hydroxyzine     Other reaction(s): Other (See Comments)   Misc. Sulfonamide Containing Compounds    Sulfa Antibiotics     Sulfamethoxazole Other (See Comments)    Headaches    Current Medications: Current Outpatient Medications  Medication Sig Dispense Refill   cephALEXin (KEFLEX) 500 MG capsule Take 1 capsule (500 mg total) by mouth 4 (four) times daily. 40 capsule 0   albuterol (VENTOLIN HFA) 108 (90 Base) MCG/ACT inhaler Inhale 2 puffs into the lungs every 6 (six) hours as needed.     ARIPiprazole (ABILIFY) 2 MG tablet      buPROPion (WELLBUTRIN XL) 150 MG 24 hr tablet Take 150 mg by mouth every morning.     Calcium Carb-Cholecalciferol (CALCIUM 500/D PO) Take by mouth.     Cholecalciferol (VITAMIN D3) 1.25 MG (50000 UT) TABS Take by mouth.     citalopram (CELEXA) 20 MG tablet Take 20 mg by mouth 2 (two) times daily.     cyanocobalamin (,VITAMIN B-12,) 1000 MCG/ML injection Inject 1,000 mcg into the muscle every 30 (thirty) days.     doxycycline (VIBRA-TABS) 100 MG tablet Take by mouth.     Flax Oil-Fish Oil-Borage Oil CAPS Take by mouth.     naloxone (NARCAN) nasal spray 4 mg/0.1 mL as directed.     ondansetron (ZOFRAN) 4 MG tablet Take by mouth.     oxyCODONE (ROXICODONE) 15 MG immediate release tablet take 1 tablet by oral route  5 times a day as needed     rosuvastatin (CRESTOR) 5 MG tablet Take 5 mg by mouth daily.     traZODone (DESYREL) 100 MG tablet TAKE 2 TABLETS BY MOUTH EVERY DAY AT BEDTIME     traZODone (DESYREL) 50 MG tablet Take one (1) tablet by mouth twice a day, as needed     No current facility-administered medications for this visit.     ASSESSMENT & PLAN:   Assessment:   1.  Monoclonal gammopathy of uncertain significance, diagnosed in July 2007. She has had increase in her M spike and serum IgG, as well as worsening anemia, which is concerning for possible progression to multiple myeloma. Bone marrow evaluation with cytogenics and myeloma FISH panel is pending from today.   2.  Severe degenerative disc disease of the spine and joints.  This significantly affects her quality of  life. She is being managed by the pain clinic.  3. Cellulitis of the left lower extremity.  We will give her IM Rocephin 2 g today and place her on cephalexin 500 mg 4 times daily.  If the redness does not improve or worsens, she will contact us.    Plan:   We will plan to see her back in 2 weeks for the results of the bone marrow biopsy. The patient understands the plans discussed today and is in agreement with them.  She knows to contact our office if she develops concerns prior to her next appointment.      Kendra Pickles, PA-C

## 2021-05-03 NOTE — Patient Instructions (Signed)
Ceftriaxone Injection What is this medication? CEFTRIAXONE (sef try AX one) treats infections caused by bacteria. It belongs to a group of medications called cephalosporin antibiotics. It will not treatcolds, the flu, or infections caused by viruses. This medicine may be used for other purposes; ask your health care provider orpharmacist if you have questions. COMMON BRAND NAME(S): Ceftrisol Plus, Rocephin What should I tell my care team before I take this medication? They need to know if you have any of these conditions: Bleeding disorder High bilirubin level in newborn patients Kidney disease Liver disease Poor nutrition An unusual or allergic reaction to ceftriaxone, other penicillin or cephalosporin antibiotics, other medicines, foods, dyes, or preservatives Pregnant or trying to get pregnant Breast-feeding How should I use this medication? This medication is injected into a vein or into a muscle. It is usually given by a health care provider in a hospital or clinic setting. It may also be givenat home. If you get this medication at home, you will be taught how to prepare and give it. Use exactly as directed. Take it as directed on the prescription label at the same time every day. Take all of this medication unless your care teamtells you to stop it early. Keep taking it even if you think you are better. It is important that you put your used needles and syringes in a special sharps container. Do not put them in a trash can. If you do not have a sharpscontainer, call your care team to get one. Talk to your care team about the use of this medication in children. While it may be prescribed for children as young as newborns for selected conditions,precautions do apply. Overdosage: If you think you have taken too much of this medicine contact apoison control center or emergency room at once. NOTE: This medicine is only for you. Do not share this medicine with others. What if I miss a dose? If  you get this medication at the hospital or clinic: It is important not tomiss your dose. Call your care team if you are unable to keep an appointment. If you give yourself this medication at home: If you miss a dose, take it as soon as you can. Then continue your normal schedule. If it is almost time for your next dose, take only that dose. Do not take double or extra doses. Callyour care team with questions. What may interact with this medication? Birth control pills Intravenous calcium This list may not describe all possible interactions. Give your health care provider a list of all the medicines, herbs, non-prescription drugs, or dietary supplements you use. Also tell them if you smoke, drink alcohol, or use illegaldrugs. Some items may interact with your medicine. What should I watch for while using this medication? Tell your care team if your symptoms do not start to get better or if they getworse. Do not treat diarrhea with over the counter products. Contact your care team ifyou have diarrhea that lasts more than 2 days or if it is severe and watery. If you have diabetes, you may get a false-positive result for sugar in yoururine. Check with your care team. If you are being treated for a sexually transmitted disease (STD), avoid sexual contact until you have finished your treatment. Your sexual partner may alsoneed treatment. What side effects may I notice from receiving this medication? Side effects that you should report to your care team as soon as possible: Allergic reactions-skin rash, itching, hives, swelling of the face, lips, tongue, or  throat Confusion Drowsiness Gallbladder problems-severe stomach pain, nausea, vomiting, fever Kidney injury-decrease in the amount of urine, swelling of the ankles, hands, or feet Kidney stones-blood in the urine, pain or trouble passing urine, pain in the lower back or sides Low red blood cell count-unusual weakness or fatigue, dizziness, headache,  trouble breathing Pancreatitis-severe stomach pain that spreads to your back or gets worse after eating or when touched, fever, nausea, vomiting Seizures Severe diarrhea, fever Unusual weakness or fatigue Side effects that usually do not require medical attention (report to your careteam if they continue or are bothersome): Diarrhea This list may not describe all possible side effects. Call your doctor for medical advice about side effects. You may report side effects to FDA at1-800-FDA-1088. Where should I keep my medication? Keep out of the reach of children and pets. You will be instructed on how to store this medication. Get rid of any unusedmedication after the expiration date. To get rid of medications that are no longer needed or have expired: Take the medication to a medication take-back program. Check with your pharmacy or law enforcement to find a location. If you cannot return the medication, ask your care team how to get rid of this medication safely. NOTE: This sheet is a summary. It may not cover all possible information. If you have questions about this medicine, talk to your doctor, pharmacist, orhealth care provider.  2022 [employer]vier/Gold Standard (2020-12-04 09:56:16)

## 2021-05-06 ENCOUNTER — Encounter: Payer: Self-pay | Admitting: Hematology and Oncology

## 2021-05-07 LAB — SURGICAL PATHOLOGY

## 2021-05-08 ENCOUNTER — Telehealth: Payer: Self-pay | Admitting: Hematology and Oncology

## 2021-05-08 ENCOUNTER — Inpatient Hospital Stay: Payer: Medicare Other | Admitting: Hematology and Oncology

## 2021-05-08 NOTE — Telephone Encounter (Signed)
Telephoned the patient to express my condolences at the loss of her sister and be sure her cellulitis was improvement.  She states that her leg pain and redness is improving and that the swelling goes down with elevation, but returns when standing.  Her bone marrow biopsy unfortunately did revealed 14% plasma cells, which with the worsening anemia and kidney function, is concerning for progression to multiple myeloma.  FISH and cytogenetics are pending.  She will contact us if her leg does not continue to improve.  She will otherwise see Dr. Hinton Rao on July 6th for the final results of bone marrow and further recommendations.  She expresses understanding.

## 2021-05-09 ENCOUNTER — Encounter: Payer: Self-pay | Admitting: Hematology and Oncology

## 2021-05-09 NOTE — Progress Notes (Signed)
[facility]  [address] [place],  [place]  [address] [phone]  Clinic Day:  05/09/2021  Referring physician: Imagene Riches, NP  This document serves as a record of services personally performed by Hosie Poisson, MD. It was created on their behalf by Curry,Lauren E, a trained medical scribe. The creation of this record is based on the scribe's personal observations and the provider's statements to them.  CHIEF COMPLAINT:  CC:  Monoclonal gammopathy of uncertain significance with worsening  Current Treatment:   Repeat evaluation  HISTORY OF PRESENT ILLNESS:  Kendra Brewer is a 65 y.o. 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  This has been very stable.  She was not seen from January 2014 to September of 2016. Unfortunately, she has chronic severe pain from severe degenerative disc and degenerative joint disease which limits her activity.  Her M-spike has remained stable over the years.  She has had mild chronic kidney disease since January 2014 with fluctuation of her creatinine. She also has had intermittent mild anemia since 2012.  She was seen in February of  2022 and at that time, her M spike was stable at 0.7.  There was an increase in the kappa light chains and elevated kappa/lambda ratio.  Quantitative immunoglobulins were normal.  She had mild anemia with a hemoglobin 11.7.  Her kidney function was fairly stable but mildly abnormal with a creatinine of 1.2.  Labs from her primary care provider in May revealed worsening anemia and mild renal insufficiency, so I brought her back in for repeat clinical assessment.  She had further increase in her serum light chains, IgG to 1651, and M spike to 0.9 at that time, so we recommended bone marrow biopsy.  INTERVAL HISTORY:  She reports depression which is well controlled with medication, but she has been more  anxious lately. Since her last visit, she was hospitalized in [facility] for syncope and dyspnea. She had an extensive workup, but does not really understand the root of the problem.  CT of the chest was negative for PE but did reveal a small left pleural effusion with left lower lobe atelectasis or infiltrate. She also has a small pericardial effusion. CT of the head revealed mild diffuse cerebral atrophy and minimal chronic small vessel white matter ischemic changes.  CT angio of the neck reveals mild calcified plaque along the right proximal internal carotid artery. She has degenerative changes of the cervical spine with a calcified disc protrusion at C3-4 with marked canal stenosis.  Kendra Brewer is here to review her bone marrow biopsy results and their implications.  Surgical pathology from June 24th revealed slightly hypercellular bone marrow for age with plasma cell neoplasm.  She has increased atypical plasma cells representing 14% of all cells, with numerous small clusters and kappa light chain restriction.  FISH is pending.  Iron stores are abundant. There is a minute focus suggestive of amyloid deposit by Congo Red.  This was especially upsetting since her sister expired last month while on treatment for advance amyloidosis.  She also has an uncle with multiple myeloma.  Her  appetite is good, and she has lost 5 pounds since her last visit.  She has her usual chronic pain and is unable to get comfortable in the exam room. She continues to take oxycodone 15 mg 5 times daily through her pain clinic. She has an appointment with [facility]  tomorrow to evaluate her dyspnea.  Hopefully they will schedule an Echocardiogram in view of the question of amyloid, as well as her pericardial effusion She denies fever, chills or other signs of infection.  She denies nausea, vomiting, bowel issues, or abdominal pain.  She denies sore throat, cough, or chest pain. She has 5 sisters, one of them is present today and  another joined our conversation by phone.   REVIEW OF SYSTEMS:  Review of Systems  Constitutional:  Negative for chills, fatigue, fever and unexpected weight change. Appetite change: Appetite poor. HENT:   Negative for lump/mass, mouth sores and sore throat.   Respiratory:  Negative for cough and shortness of breath.   Cardiovascular:  Negative for chest pain and leg swelling.  Gastrointestinal:  Negative for abdominal pain, constipation, diarrhea, nausea and vomiting.  Genitourinary:  Negative for difficulty urinating, dysuria, frequency and hematuria.   Musculoskeletal:  Positive for back pain (chronic, stable). Negative for arthralgias and myalgias.  Skin:  Negative for rash. Wound: left lower extremity. Neurological:  Positive for dizziness.  Hematological:  Negative for adenopathy. Does not bruise/bleed easily.  Psychiatric/Behavioral:  Negative for depression and sleep disturbance. The patient is not nervous/anxious.    VITALS:  There were no vitals taken for this visit.  Wt Readings from Last 3 Encounters:  05/03/21 146 lb (66.2 kg)  05/03/21 146 lb 14.4 oz (66.6 kg)  04/17/21 143 lb 11.2 oz (65.2 kg)    There is no height or weight on file to calculate BMI.  Performance status (ECOG): 2 - Symptomatic, <50% confined to bed  PHYSICAL EXAM:  Physical Exam Vitals and nursing note reviewed.  Constitutional:      General: She is not in acute distress.    Appearance: Normal appearance.  Cardiovascular:     Rate and Rhythm: Normal rate and regular rhythm.     Heart sounds: Normal heart sounds. No murmur heard.   No friction rub. No gallop.  Pulmonary:     Effort: Pulmonary effort is normal.     Breath sounds: Normal breath sounds. No wheezing, rhonchi or rales.  Chest:  Breasts:    Right: No axillary adenopathy or supraclavicular adenopathy.     Left: No axillary adenopathy or supraclavicular adenopathy.  Abdominal:     Palpations: There is no hepatomegaly or  splenomegaly.  Musculoskeletal:        General: Tenderness: tenderness of distal left lower extremity area of wound and redness. Normal range of motion.     Right lower leg: No edema.     Left lower leg: Left lower leg edema: 1-2+.  Lymphadenopathy:     Upper Body:     Right upper body: No supraclavicular or axillary adenopathy.     Left upper body: No supraclavicular or axillary adenopathy.     Lower Body: No right inguinal adenopathy. No left inguinal adenopathy.  Skin:    General: Skin is warm and dry.     Coloration: Skin is not jaundiced.     Findings: No rash. Erythema: erythema surrounding the wound of the left lower extremity with overlying warmth. Lesion: wound of the distal right lower extremity with overlying eschar. Neurological:     Mental Status: She is alert and oriented to person, place, and time.     Cranial Nerves: No cranial nerve deficit.  Psychiatric:        Mood and Affect: Mood normal.        Behavior: Behavior normal.  Thought Content: Thought content normal.   LABS:   CBC Latest Ref Rng & Units 05/03/2021 04/09/2021 01/03/2021  WBC - 7.3 3.5 3.7  Hemoglobin 12.0 - 16.0 10.9(A) 10.9(A) 11.7(A)  Hematocrit 36 - 46 33(A) 33(A) 36  Platelets 150 - 399 258 280 245   CMP Latest Ref Rng & Units 05/03/2021 04/09/2021 01/03/2021  BUN 4 - 21 20 12 12   Creatinine 0.5 - 1.1 1.2(A) 1.2(A) 1.1  Sodium 137 - 147 134(A) 138 137  Potassium 3.4 - 5.3 4.1 4.1 4.2  Chloride 99 - 108 101 103 104  CO2 13 - 22 23(A) 26(A) 26(A)  Calcium 8.7 - 10.7 8.5(A) 8.7 8.8  Alkaline Phos 25 - 125 107 117 89  AST 13 - 35 29 26 28   ALT 7 - 35 22 15 18    Lab Results  Component Value Date   TOTALPROTELP 6.9 04/09/2021   ALBUMINELP 3.7 01/03/2021   A1GS 0.2 01/03/2021   A2GS 0.7 01/03/2021   BETS 0.9 01/03/2021   GAMS 1.3 01/03/2021   MSPIKE 0.7 (H) 01/03/2021   SPEI Comment 01/03/2021   Lab Results  Component Value Date   TIBC 335 04/09/2021   FERRITIN 94 04/09/2021    IRONPCTSAT 20 04/09/2021   No results found for: LDH    Kappa/lambda light chains Component Ref Range & Units 1 mo ago 4 mo ago  Kappa free light chain 3.3 - 19.4 mg/L 280.9 High   244.6 High    Lamda free light chains 5.7 - 26.3 mg/L 24.7  17.5   Kappa, lamda light chain ratio 0.26 - 1.65 11.37 High   13.98 High  CM     Multiple Myeloma Panel (SPEP&IFE w/QIG) Component Ref Range & Units 1 mo ago  (04/09/21) 4 mo ago  (01/03/21) 4 mo ago  (01/03/21)  IgG (Immunoglobin G), Serum 586 - 1,602 mg/dL 1,651 High    1,457   IgA 87 - 352 mg/dL 373 High    293   IgM (Immunoglobulin M), Srm 26 - 217 mg/dL 33   34 CM   Total Protein ELP 6.0 - 8.5 g/dL 6.9 VC  6.8    Albumin SerPl Elph-Mcnc 2.9 - 4.4 g/dL 3.3 VC     Alpha 1 0.0 - 0.4 g/dL 0.3 VC     Alpha2 Glob SerPl Elph-Mcnc 0.4 - 1.0 g/dL 0.8 VC     B-Globulin SerPl Elph-Mcnc 0.7 - 1.3 g/dL 1.0 VC     Gamma Glob SerPl Elph-Mcnc 0.4 - 1.8 g/dL 1.5 VC     M Protein SerPl Elph-Mcnc Not Observed g/dL 0.9 High  VC     Globulin, Total 2.2 - 3.9 g/dL 3.6 VC  3.1 VC    Albumin/Glob SerPl 0.7 - 1.7 1.0 VC      Bone Marrow Report:  Surgical Pathology  CASE: [id number]  PATIENT: Kendra Brewer   Clinical History: MGUS   DIAGNOSIS:   BONE MARROW, ASPIRATE, CLOT, CORE:  -Slightly hypercellular bone marrow for age with plasma cell neoplasm  -See comment   PERIPHERAL BLOOD:  -Macrocytic anemia   COMMENT:   The bone marrow is slightly hypercellular for age with increased number  of atypical plasma cells representing 14% of all cells associated with  numerous small clusters.  The plasma cells are kappa light chain  restricted consistent with plasma cell neoplasm.  A minute focus  suggestive of amyloid deposit is seen.  The background shows trilineage hematopoiesis with mild dyspoietic changes.  In this  setting, the changes are not considered specific and may be secondary in nature but correlation with cytogenetic and FISH studies is  recommended.   MICROSCOPIC DESCRIPTION:   PERIPHERAL BLOOD SMEAR: The red blood cells display mild  anisopoikilocytosis with mild polychromasia.  The white blood cells and  platelets are normal in number   BONE MARROW ASPIRATE: Bone marrow particles present  Erythroid precursors: Progressive maturation with many late precursors  displaying nuclear cytoplasmic dyssynchrony  Granulocytic precursors: Orderly and progressive maturation for the most part  Megakaryocytes: Abundant with scattered small hypolobated forms  Lymphocytes/plasma cells: The plasma cells are increased in number  representing 14% of all cells associated with atypical cytomorphologic  features characterized by cytomegaly and/or small nucleoli.  Large  lymphoid aggregates are not seen.   TOUCH PREPARATIONS: A mixture of cell types present   CLOT AND BIOPSY: The core biopsy is suboptimal due to prominent  aspiration artifacts.  The clot sections display 30 to 60% cellularity  with a mixture of myeloid cell types.  Scattered small plasma cell  clusters are present but large aggregates or sheets are not seen.  Occasional very small interstitial and well-circumscribed lymphoid  aggregates composed of small lymphoid cells are seen.  A small vessel  shows thickening of the wall by eosinophilic acellular material.  Immunohistochemical stain for CD138 and in situ hybridization for kappa and lambda were performed on block B1 with appropriate controls.  CD138 highlights the plasma cell component consisting of interstitial cells and numerous small clusters and displays kappa light chain restriction. Congo red stain was performed on blocks B1 and C1 with appropriate controls.   A small focus of very faint staining suggestive of amyloid is seen.   IRON STAIN: Iron stains are performed on a bone marrow aspirate or touch imprint smear and section of clot. The controls stained appropriately.        Storage Iron: Abundant       Ring  Sideroblasts: Absent   ADDITIONAL DATA/TESTING: The specimen was sent for cytogenetic analysis and FISH for multiple myeloma and a separate report will follow.   CELL COUNT DATA:   Bone Marrow count performed on 500 cells shows:  Blasts:   0%   Myeloid:  45%  Promyelocytes: 0%   Erythroid:     32%  Myelocytes:    11%  Lymphocytes:   7%  Metamyelocytes:     2%   Plasma cells:  14%  Bands:    4%  Neutrophils:   26%  M:E ratio:     1.4  Eosinophils:   2%  Basophils:     0%  Monocytes:     2%   Lab Data: CBC performed on 05/03/21 shows:  WBC: 7.3 k/uL  Neutrophils:   73%  Hgb: 10.9 g/dL Lymphocytes:   20%  HCT: 32.7 %    Monocytes:     6%  MCV: 106 fL    Eosinophils:   0%  RDW: 15.9 %    Basophils:     1%  PLT: 258 k/uL    STUDIES:  No results found.    HISTORY:   Allergies:  Allergies  Allergen Reactions   Gabapentin Other (See Comments)   Hydroxyzine     Other reaction(s): Other (See Comments)   Misc. Sulfonamide Containing Compounds    Sulfa Antibiotics    Sulfamethoxazole Other (See Comments)    Headaches    Current Medications: Current Outpatient Medications  Medication Sig Dispense Refill  albuterol (VENTOLIN HFA) 108 (90 Base) MCG/ACT inhaler Inhale 2 puffs into the lungs every 6 (six) hours as needed.     ARIPiprazole (ABILIFY) 2 MG tablet      buPROPion (WELLBUTRIN XL) 150 MG 24 hr tablet Take 150 mg by mouth every morning.     Calcium Carb-Cholecalciferol (CALCIUM 500/D PO) Take by mouth.     cephALEXin (KEFLEX) 500 MG capsule Take 1 capsule (500 mg total) by mouth 4 (four) times daily. 40 capsule 0   Cholecalciferol (VITAMIN D3) 1.25 MG (50000 UT) TABS Take by mouth.     citalopram (CELEXA) 20 MG tablet Take 20 mg by mouth 2 (two) times daily.     cyanocobalamin (,VITAMIN B-12,) 1000 MCG/ML injection Inject 1,000 mcg into the muscle every 30 (thirty) days.     doxycycline (VIBRA-TABS) 100 MG tablet Take by mouth.     Flax Oil-Fish Oil-Borage Oil CAPS  Take by mouth.     naloxone (NARCAN) nasal spray 4 mg/0.1 mL as directed.     ondansetron (ZOFRAN) 4 MG tablet Take by mouth.     oxyCODONE (ROXICODONE) 15 MG immediate release tablet take 1 tablet by oral route  5 times a day as needed     rosuvastatin (CRESTOR) 5 MG tablet Take 5 mg by mouth daily.     traZODone (DESYREL) 100 MG tablet TAKE 2 TABLETS BY MOUTH EVERY DAY AT BEDTIME     traZODone (DESYREL) 50 MG tablet Take one (1) tablet by mouth twice a day, as needed     No current facility-administered medications for this visit.   ASSESSMENT & PLAN:   Assessment:  1.  Monoclonal gammopathy of uncertain significance, diagnosed in July 2007. She has had increase in her M spike and serum IgG, as well as worsening anemia, which is concerning for possible progression to multiple myeloma. She has anemia and mild renal dysfunction but no lytic bone lesions or hypercalcemia.  Bone marrow evaluation revealed slightly hypercellular bone marrow for age with plasma cell neoplasm and atypical plasma cells of 14%.  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  Cytogenics and myeloma FISH panel is pending.   2.  Severe degenerative disc disease of the spine and joints.  This significantly affects her quality of life. She is being managed by the pain clinic.   Plan:    I have recommended further evaluation with skeletal survey and 24 hour urine for urine protein electrophoresis.  I would recommend an Echocardiogram and she can have that through the cardiologist. Unless these tests show significant abnormalities, I would recommend observation since the M spike is only 0.9 and the IgG is in a normal range. I will call her on the results and plan to see her back in 1-2 months with CBC, CMP, SPEP, serum light chains and quantitative immunoglobulins. We also discussed referral for a second opinion to a tertiary center such as [facility] in view  of the question of amyloid.  The patient and her sister understand the plans discussed today and are in agreement with them.  She knows to contact our office if she develops concerns prior to her next appointment.   I, Rita Ohara, am acting as scribe for Derwood Kaplan, MD  I have reviewed this report as typed by the medical scribe, and it is complete and accurate.

## 2021-05-14 ENCOUNTER — Encounter (HOSPITAL_COMMUNITY): Payer: Self-pay | Admitting: Oncology

## 2021-05-15 ENCOUNTER — Inpatient Hospital Stay: Payer: Medicare Other | Attending: Hematology and Oncology | Admitting: Oncology

## 2021-05-15 ENCOUNTER — Encounter: Payer: Self-pay | Admitting: Oncology

## 2021-05-15 ENCOUNTER — Inpatient Hospital Stay: Payer: Medicare Other | Admitting: Hematology and Oncology

## 2021-05-15 ENCOUNTER — Other Ambulatory Visit: Payer: Self-pay

## 2021-05-15 VITALS — BP 114/72 | HR 102 | Temp 98.3°F | Resp 18 | Ht 60.0 in | Wt 140.6 lb

## 2021-05-15 DIAGNOSIS — C9000 Multiple myeloma not having achieved remission: Secondary | ICD-10-CM | POA: Diagnosis not present

## 2021-05-15 DIAGNOSIS — D472 Monoclonal gammopathy: Secondary | ICD-10-CM | POA: Diagnosis not present

## 2021-05-16 ENCOUNTER — Telehealth: Payer: Self-pay

## 2021-05-16 NOTE — Telephone Encounter (Addendum)
Pt states she forgot to show Dr Hinton Rao her leg yesterday. I told her to f/u with PCP as you recommended. ----- Message from Marvia Pickles, PA-C sent at 05/16/2021  3:17 PM EDT ----- Regarding: RE: area on leg not much better Contact: [phone] She just saw Dr. Hinton Rao yesterday. I think this is ok. See PCP if worsens. Thanks! ----- Message ----- From: Dairl Ponder, RN Sent: 05/16/2021   2:59 PM EDT To: Marvia Pickles, PA-C Subject: area on leg not much better                    Pt called to give you an update, as requested. She states, "the area on my leg is not 100%  better. As long as I keep my leg elevated, the swelling goes down. When I get up, the swelling returns".   I called pt. She states the area is not as red, does have scabs still. It doesn't feel warm. Afebrile.

## 2021-05-17 ENCOUNTER — Encounter: Payer: Self-pay | Admitting: Hematology and Oncology

## 2021-05-25 ENCOUNTER — Encounter: Payer: Self-pay | Admitting: Hematology and Oncology

## 2021-06-25 ENCOUNTER — Encounter: Payer: Self-pay | Admitting: Oncology

## 2021-06-27 ENCOUNTER — Ambulatory Visit: Payer: Medicare Other | Admitting: Oncology

## 2021-06-27 ENCOUNTER — Other Ambulatory Visit: Payer: Medicare Other

## 2021-07-11 DEATH — deceased

## 2022-01-03 ENCOUNTER — Other Ambulatory Visit: Payer: Medicare Other

## 2022-01-03 ENCOUNTER — Ambulatory Visit: Payer: Medicare Other | Admitting: Oncology
